# Patient Record
Sex: Male | Born: 1960
Health system: Southern US, Community
[De-identification: ages and names within clinical notes are randomized; demographics above are authoritative.]

## PROBLEM LIST (undated history)

## (undated) DIAGNOSIS — M1990 Unspecified osteoarthritis, unspecified site: Secondary | ICD-10-CM

## (undated) DIAGNOSIS — T8859XA Other complications of anesthesia, initial encounter: Secondary | ICD-10-CM

## (undated) DIAGNOSIS — T4145XA Adverse effect of unspecified anesthetic, initial encounter: Secondary | ICD-10-CM

## (undated) DIAGNOSIS — H409 Unspecified glaucoma: Secondary | ICD-10-CM

## (undated) HISTORY — DX: Unspecified glaucoma: H40.9

## (undated) HISTORY — PX: ROTATOR CUFF REPAIR: SHX139

## (undated) HISTORY — PX: COLONOSCOPY: SHX174

## (undated) HISTORY — PX: EYE SURGERY: SHX253

## (undated) HISTORY — PX: TONSILLECTOMY: SUR1361

---

## 2000-01-25 ENCOUNTER — Emergency Department (HOSPITAL_COMMUNITY): Admission: EM | Admit: 2000-01-25 | Discharge: 2000-01-25 | Payer: Self-pay | Admitting: Emergency Medicine

## 2002-03-23 ENCOUNTER — Encounter: Payer: Self-pay | Admitting: *Deleted

## 2002-03-23 ENCOUNTER — Ambulatory Visit (HOSPITAL_COMMUNITY): Admission: RE | Admit: 2002-03-23 | Discharge: 2002-03-23 | Payer: Self-pay | Admitting: *Deleted

## 2002-12-21 ENCOUNTER — Ambulatory Visit: Admission: RE | Admit: 2002-12-21 | Discharge: 2002-12-21 | Payer: Self-pay

## 2004-12-08 ENCOUNTER — Ambulatory Visit (HOSPITAL_COMMUNITY): Admission: RE | Admit: 2004-12-08 | Discharge: 2004-12-08 | Payer: Self-pay | Admitting: Orthopedic Surgery

## 2013-05-16 ENCOUNTER — Encounter (HOSPITAL_COMMUNITY): Payer: Self-pay | Admitting: Pharmacy Technician

## 2013-05-17 ENCOUNTER — Encounter (HOSPITAL_COMMUNITY)
Admission: RE | Admit: 2013-05-17 | Discharge: 2013-05-17 | Disposition: A | Discharge status: Home / Self Care | Payer: BC Managed Care – PPO | Source: Ambulatory Visit | Attending: Orthopedic Surgery | Admitting: Orthopedic Surgery

## 2013-05-17 ENCOUNTER — Encounter (HOSPITAL_COMMUNITY): Payer: Self-pay

## 2013-05-17 DIAGNOSIS — Z01812 Encounter for preprocedural laboratory examination: Secondary | ICD-10-CM | POA: Insufficient documentation

## 2013-05-17 HISTORY — DX: Unspecified osteoarthritis, unspecified site: M19.90

## 2013-05-17 HISTORY — DX: Adverse effect of unspecified anesthetic, initial encounter: T41.45XA

## 2013-05-17 HISTORY — DX: Other complications of anesthesia, initial encounter: T88.59XA

## 2013-05-17 LAB — BASIC METABOLIC PANEL
BUN: 16 mg/dL (ref 6–23)
CO2: 32 mEq/L (ref 19–32)
Calcium: 9.6 mg/dL (ref 8.4–10.5)
Chloride: 100 mEq/L (ref 96–112)
Creatinine, Ser: 0.93 mg/dL (ref 0.50–1.35)
GFR calc Af Amer: >90 mL/min (ref >90)
GFR calc non Af Amer: >90 mL/min (ref >90)
Glucose, Bld: 92 mg/dL (ref 70–99)
Potassium: 4.9 mEq/L (ref 3.5–5.1)
Sodium: 137 mEq/L (ref 135–145)

## 2013-05-17 LAB — CBC
HCT: 42 % (ref 39.0–52.0)
Hemoglobin: 14.5 g/dL (ref 13.0–17.0)
MCH: 29.3 pg (ref 26.0–34.0)
MCV: 84.8 fL (ref 78.0–100.0)
RBC: 4.95 MIL/uL (ref 4.22–5.81)

## 2013-05-17 LAB — APTT: aPTT: 33 seconds (ref 24–37)

## 2013-05-17 LAB — PROTIME-INR
INR: 1.03 (ref 0.00–1.49)
Prothrombin Time: 13.4 seconds (ref 11.6–15.2)

## 2013-05-17 LAB — SURGICAL PCR SCREEN: Staphylococcus aureus: NEGATIVE

## 2013-05-17 NOTE — Patient Instructions (Addendum)
20 Anthony Warren.  05/17/2013   Your procedure is scheduled on: 05/29/13  Report to [facility] at 2:15 PM.  Call this number if you have problems the morning of surgery [phone]   Remember:   Do not eat food After Midnight, clear liquids from midnight until 1115 on 05/29/13 then nothing.     Do not wear jewelry, make-up or nail polish.  Do not wear lotions, powders, or perfumes. You may wear deodorant.  Do not shave 48 hours prior to surgery. Men may shave face and neck.  Do not bring valuables to the hospital.  Contacts, dentures or bridgework may not be worn into surgery.  Leave suitcase in the car. After surgery it may be brought to your room.  For patients admitted to the hospital, checkout time is 11:00 AM the day of discharge.   Please read over the following fact sheets that you were given: MRSA Information, clear liquids fact sheet, blood fact sheet Birdie Sons, RN  pre op nurse call if needed [phone]    FAILURE TO FOLLOW THESE INSTRUCTIONS MAY RESULT IN CANCELLATION OF YOUR SURGERY   Patient Signature: ___________________________________________

## 2013-05-25 NOTE — Progress Notes (Signed)
Pt called about surgery time change from 05/29/13 to 05/28/13. Pt agreed to be at short stay by 1245pm.

## 2013-05-27 NOTE — H&P (Signed)
TOTAL HIP ADMISSION H&P  Patient is admitted for right total hip arthroplasty, anterior approach.  Subjective:  Chief Complaint: right hip OA / pain.  HPI: Anthony Banning Sr., 52 y.o. male, has a history of pain and functional disability in the right hip(s) due to arthritis and patient has failed non-surgical conservative treatments for greater than 12 weeks to include NSAID's and/or analgesics, corticosteriod injections and activity modification.  Onset of symptoms was gradual starting 2 years ago with rapidlly worsening course since that time.The patient noted no past surgery on the right hip(s).  Patient currently rates pain in the right hip at 8 out of 10 with activity. Patient has night pain, worsening of pain with activity and weight bearing, trendelenberg gait, pain that interfers with activities of daily living, pain with passive range of motion and joint swelling. Patient has evidence of periarticular osteophytes and joint space narrowing by imaging studies. This condition presents safety issues increasing the risk of falls.  There is no current active infection.  Risks, benefits and expectations were discussed with the patient. Patient understand the risks, benefits and expectations and wishes to proceed with surgery.   D/C Plans:   Home with HHPT  Post-op Meds:    ?  Tranexamic Acid:   To be given  Decadron:    To be given  FYI:    ASA post-op   Past Medical History  Diagnosis Date  . Arthritis     in hips  . Complication of anesthesia     hard to wake up from 2nd retinal surgery (6weeks after first)    Past Surgical History  Procedure Laterality Date  . Eye surgery Bilateral     retinal detachment, cataracts, right eye glaucoma  . Rotator cuff repair Left 5 years ago  . Tonsillectomy      Allergies  Allergen Reactions  . Penicillins Rash    History  Substance Use Topics  . Smoking status: Never Smoker   . Smokeless tobacco: Never Used  . Alcohol Use: Yes   Comment: about 1 a day    No family history on file.   Review of Systems  Constitutional: Negative.   HENT: Negative.   Eyes: Negative.   Respiratory: Negative.   Cardiovascular: Negative.   Gastrointestinal: Negative.   Genitourinary: Negative.   Musculoskeletal: Positive for joint pain.  Skin: Negative.   Neurological: Negative.   Endo/Heme/Allergies: Negative.   Psychiatric/Behavioral: Negative.     Objective:  Physical Exam  Constitutional: He is oriented to person, place, and time. He appears well-developed and well-nourished.  HENT:  Head: Normocephalic and atraumatic.  Mouth/Throat: Oropharynx is clear and moist.  Eyes: Pupils are equal, round, and reactive to light.  Neck: Neck supple. No JVD present. No tracheal deviation present. No thyromegaly present.  Cardiovascular: Normal rate, regular rhythm, normal heart sounds and intact distal pulses.   Respiratory: Effort normal and breath sounds normal. No stridor. No respiratory distress. He has no wheezes.  GI: Soft. There is no tenderness. There is no guarding.  Musculoskeletal:       Right hip: He exhibits decreased range of motion, decreased strength, tenderness and bony tenderness. He exhibits no swelling, no deformity and no laceration.  Lymphadenopathy:    He has no cervical adenopathy.  Neurological: He is alert and oriented to person, place, and time.  Skin: Skin is warm and dry.  Psychiatric: He has a normal mood and affect.     Imaging Review Plain radiographs demonstrate severe  degenerative joint disease of the right hip(s). The bone quality appears to be good for age and reported activity level.  Assessment/Plan:  End stage arthritis, right hip(s)  The patient history, physical examination, clinical judgement of the provider and imaging studies are consistent with end stage degenerative joint disease of the right hip(s) and total hip arthroplasty is deemed medically necessary. The treatment options  including medical management, injection therapy, arthroscopy and arthroplasty were discussed at length. The risks and benefits of total hip arthroplasty were presented and reviewed. The risks due to aseptic loosening, infection, stiffness, dislocation/subluxation,  thromboembolic complications and other imponderables were discussed.  The patient acknowledged the explanation, agreed to proceed with the plan and consent was signed. Patient is being admitted for inpatient treatment for surgery, pain control, PT, OT, prophylactic antibiotics, VTE prophylaxis, progressive ambulation and ADL's and discharge planning.The patient is planning to be discharged home with home health services.   Anastasio Auerbach Camren Henthorn   PAC  05/27/2013, 5:46 PM

## 2013-05-28 ENCOUNTER — Ambulatory Visit (HOSPITAL_COMMUNITY): Payer: BC Managed Care – PPO | Admitting: Anesthesiology

## 2013-05-28 ENCOUNTER — Encounter (HOSPITAL_COMMUNITY)
Admission: RE | Disposition: A | Discharge status: Home Health | Payer: Self-pay | Source: Ambulatory Visit | Attending: Orthopedic Surgery

## 2013-05-28 ENCOUNTER — Inpatient Hospital Stay (HOSPITAL_COMMUNITY): Payer: BC Managed Care – PPO

## 2013-05-28 ENCOUNTER — Encounter (HOSPITAL_COMMUNITY): Payer: Self-pay

## 2013-05-28 ENCOUNTER — Ambulatory Visit (HOSPITAL_COMMUNITY): Payer: BC Managed Care – PPO

## 2013-05-28 ENCOUNTER — Encounter (HOSPITAL_COMMUNITY): Payer: Self-pay | Admitting: Anesthesiology

## 2013-05-28 ENCOUNTER — Inpatient Hospital Stay (HOSPITAL_COMMUNITY)
Admission: RE | Admit: 2013-05-28 | Discharge: 2013-05-29 | DRG: 818 | Disposition: A | Discharge status: Home Health | Payer: BC Managed Care – PPO | Source: Ambulatory Visit | Attending: Orthopedic Surgery | Admitting: Orthopedic Surgery

## 2013-05-28 DIAGNOSIS — D62 Acute posthemorrhagic anemia: Secondary | ICD-10-CM | POA: Diagnosis not present

## 2013-05-28 DIAGNOSIS — D500 Iron deficiency anemia secondary to blood loss (chronic): Secondary | ICD-10-CM | POA: Diagnosis not present

## 2013-05-28 DIAGNOSIS — M1610 Unilateral primary osteoarthritis, unspecified hip: Principal | ICD-10-CM | POA: Diagnosis present

## 2013-05-28 DIAGNOSIS — M169 Osteoarthritis of hip, unspecified: Principal | ICD-10-CM | POA: Diagnosis present

## 2013-05-28 DIAGNOSIS — Z880 Allergy status to penicillin: Secondary | ICD-10-CM

## 2013-05-28 DIAGNOSIS — Z96649 Presence of unspecified artificial hip joint: Secondary | ICD-10-CM

## 2013-05-28 DIAGNOSIS — M87059 Idiopathic aseptic necrosis of unspecified femur: Secondary | ICD-10-CM | POA: Diagnosis present

## 2013-05-28 HISTORY — PX: TOTAL HIP ARTHROPLASTY: SHX124

## 2013-05-28 LAB — TYPE AND SCREEN: Antibody Screen: NEGATIVE

## 2013-05-28 LAB — URINALYSIS, ROUTINE W REFLEX MICROSCOPIC
Leukocytes, UA: NEGATIVE
Nitrite: NEGATIVE
Specific Gravity, Urine: 1.008 (ref 1.005–1.030)
Urobilinogen, UA: 0.2 mg/dL (ref 0.0–1.0)
pH: 5.5 (ref 5.0–8.0)

## 2013-05-28 SURGERY — ARTHROPLASTY, HIP, TOTAL, ANTERIOR APPROACH
Anesthesia: General | Site: Hip | Laterality: Right | Wound class: Clean

## 2013-05-28 MED ORDER — CLINDAMYCIN PHOSPHATE 900 MG/50ML IV SOLN
900.0000 mg | INTRAVENOUS | Status: DC
  Filled 2013-05-28: qty 50

## 2013-05-28 MED ORDER — HYDROCODONE-ACETAMINOPHEN 7.5-325 MG PO TABS
1.0000 | ORAL_TABLET | ORAL | Status: DC
  Administered 2013-05-28: 1 via ORAL
  Administered 2013-05-29 (×3): 2 via ORAL
  Administered 2013-05-29: 1 via ORAL
  Filled 2013-05-28 (×5): qty 2

## 2013-05-28 MED ORDER — KETOROLAC TROMETHAMINE 30 MG/ML IJ SOLN
INTRAMUSCULAR | Status: AC
  Filled 2013-05-28: qty 1

## 2013-05-28 MED ORDER — PHENOL 1.4 % MT LIQD: 1.0000 | OROMUCOSAL | Status: DC | PRN

## 2013-05-28 MED ORDER — MIDAZOLAM HCL 5 MG/5ML IJ SOLN
INTRAMUSCULAR | Status: DC | PRN
  Administered 2013-05-28: 2 mg via INTRAVENOUS

## 2013-05-28 MED ORDER — DIPHENHYDRAMINE HCL 25 MG PO CAPS: 25.0000 mg | ORAL_CAPSULE | Freq: Four times a day (QID) | ORAL | Status: DC | PRN

## 2013-05-28 MED ORDER — ONDANSETRON HCL 4 MG/2ML IJ SOLN
INTRAMUSCULAR | Status: DC | PRN
  Administered 2013-05-28: 4 mg via INTRAVENOUS

## 2013-05-28 MED ORDER — NEOSTIGMINE METHYLSULFATE 1 MG/ML IJ SOLN
INTRAMUSCULAR | Status: DC | PRN
  Administered 2013-05-28: 3 mg via INTRAVENOUS

## 2013-05-28 MED ORDER — METOCLOPRAMIDE HCL 5 MG/ML IJ SOLN: 5.0000 mg | Freq: Three times a day (TID) | INTRAMUSCULAR | Status: DC | PRN

## 2013-05-28 MED ORDER — BUPIVACAINE LIPOSOME 1.3 % IJ SUSP
20.0000 mL | Freq: Once | INTRAMUSCULAR | Status: DC
  Filled 2013-05-28: qty 20

## 2013-05-28 MED ORDER — HYDROMORPHONE HCL PF 1 MG/ML IJ SOLN
INTRAMUSCULAR | Status: AC
  Filled 2013-05-28: qty 1

## 2013-05-28 MED ORDER — HYDROMORPHONE HCL PF 1 MG/ML IJ SOLN
0.2500 mg | INTRAMUSCULAR | Status: DC | PRN
  Administered 2013-05-28: 0.5 mg via INTRAVENOUS
  Administered 2013-05-28: 0.25 mg via INTRAVENOUS

## 2013-05-28 MED ORDER — CHLORHEXIDINE GLUCONATE 4 % EX LIQD
60.0000 mL | Freq: Once | CUTANEOUS | Status: DC
  Filled 2013-05-28: qty 60

## 2013-05-28 MED ORDER — HYDROMORPHONE HCL PF 1 MG/ML IJ SOLN
INTRAMUSCULAR | Status: DC | PRN
  Administered 2013-05-28 (×2): 1 mg via INTRAVENOUS

## 2013-05-28 MED ORDER — ONDANSETRON HCL 4 MG/2ML IJ SOLN
4.0000 mg | Freq: Four times a day (QID) | INTRAMUSCULAR | Status: DC | PRN
  Administered 2013-05-29: 4 mg via INTRAVENOUS
  Filled 2013-05-28: qty 2

## 2013-05-28 MED ORDER — HYDROMORPHONE HCL PF 1 MG/ML IJ SOLN: 0.5000 mg | INTRAMUSCULAR | Status: DC | PRN

## 2013-05-28 MED ORDER — DEXAMETHASONE SODIUM PHOSPHATE 10 MG/ML IJ SOLN
INTRAMUSCULAR | Status: DC | PRN
  Administered 2013-05-28: 10 mg via INTRAVENOUS

## 2013-05-28 MED ORDER — BUPIVACAINE-EPINEPHRINE PF 0.25-1:200000 % IJ SOLN
INTRAMUSCULAR | Status: AC
  Filled 2013-05-28: qty 30

## 2013-05-28 MED ORDER — FENTANYL CITRATE 0.05 MG/ML IJ SOLN
INTRAMUSCULAR | Status: DC | PRN
  Administered 2013-05-28: 50 ug via INTRAVENOUS
  Administered 2013-05-28: 100 ug via INTRAVENOUS
  Administered 2013-05-28 (×2): 50 ug via INTRAVENOUS

## 2013-05-28 MED ORDER — SODIUM CHLORIDE 0.9 % IV SOLN
100.0000 mL/h | INTRAVENOUS | Status: DC
  Administered 2013-05-28: 100 mL/h via INTRAVENOUS
  Filled 2013-05-28 (×3): qty 1000

## 2013-05-28 MED ORDER — LIDOCAINE HCL (PF) 2 % IJ SOLN
INTRAMUSCULAR | Status: DC | PRN
  Administered 2013-05-28: 30 mg

## 2013-05-28 MED ORDER — CLINDAMYCIN PHOSPHATE 600 MG/50ML IV SOLN
600.0000 mg | Freq: Four times a day (QID) | INTRAVENOUS | Status: AC
  Administered 2013-05-28 – 2013-05-29 (×2): 600 mg via INTRAVENOUS
  Filled 2013-05-28 (×2): qty 50

## 2013-05-28 MED ORDER — LACTATED RINGERS IV SOLN
INTRAVENOUS | Status: DC
  Administered 2013-05-28: 18:00:00 via INTRAVENOUS
  Administered 2013-05-28: 1000 mL via INTRAVENOUS

## 2013-05-28 MED ORDER — ROCURONIUM BROMIDE 100 MG/10ML IV SOLN
INTRAVENOUS | Status: DC | PRN
  Administered 2013-05-28: 50 mg via INTRAVENOUS
  Administered 2013-05-28: 10 mg via INTRAVENOUS
  Administered 2013-05-28: 20 mg via INTRAVENOUS

## 2013-05-28 MED ORDER — ASPIRIN EC 325 MG PO TBEC
325.0000 mg | DELAYED_RELEASE_TABLET | Freq: Two times a day (BID) | ORAL | Status: DC
  Administered 2013-05-29: 325 mg via ORAL
  Filled 2013-05-28 (×3): qty 1

## 2013-05-28 MED ORDER — CEFAZOLIN SODIUM-DEXTROSE 2-3 GM-% IV SOLR
INTRAVENOUS | Status: DC | PRN
  Administered 2013-05-28: 2 g via INTRAVENOUS

## 2013-05-28 MED ORDER — METOCLOPRAMIDE HCL 5 MG PO TABS: 5.0000 mg | ORAL_TABLET | Freq: Three times a day (TID) | ORAL | Status: DC | PRN

## 2013-05-28 MED ORDER — METHOCARBAMOL 100 MG/ML IJ SOLN
500.0000 mg | Freq: Four times a day (QID) | INTRAVENOUS | Status: DC | PRN
  Administered 2013-05-28: 500 mg via INTRAVENOUS
  Filled 2013-05-28: qty 5

## 2013-05-28 MED ORDER — DEXAMETHASONE SODIUM PHOSPHATE 10 MG/ML IJ SOLN
10.0000 mg | Freq: Once | INTRAMUSCULAR | Status: AC
  Administered 2013-05-29: 10 mg via INTRAVENOUS
  Filled 2013-05-28: qty 1

## 2013-05-28 MED ORDER — BISACODYL 10 MG RE SUPP: 10.0000 mg | Freq: Every day | RECTAL | Status: DC | PRN

## 2013-05-28 MED ORDER — KETOROLAC TROMETHAMINE 30 MG/ML IJ SOLN
INTRAMUSCULAR | Status: DC | PRN
  Administered 2013-05-28: 15 mg via INTRAVENOUS

## 2013-05-28 MED ORDER — LACTATED RINGERS IV SOLN: INTRAVENOUS | Status: DC

## 2013-05-28 MED ORDER — GLYCOPYRROLATE 0.2 MG/ML IJ SOLN
INTRAMUSCULAR | Status: DC | PRN
  Administered 2013-05-28: .5 mg via INTRAVENOUS

## 2013-05-28 MED ORDER — PROPOFOL 10 MG/ML IV BOLUS
INTRAVENOUS | Status: DC | PRN
  Administered 2013-05-28: 200 mg via INTRAVENOUS

## 2013-05-28 MED ORDER — FERROUS SULFATE 325 (65 FE) MG PO TABS
325.0000 mg | ORAL_TABLET | Freq: Three times a day (TID) | ORAL | Status: DC
  Administered 2013-05-29 (×2): 325 mg via ORAL
  Filled 2013-05-28 (×4): qty 1

## 2013-05-28 MED ORDER — ZOLPIDEM TARTRATE 5 MG PO TABS: 5.0000 mg | ORAL_TABLET | Freq: Every evening | ORAL | Status: DC | PRN

## 2013-05-28 MED ORDER — FLEET ENEMA 7-19 GM/118ML RE ENEM: 1.0000 | ENEMA | Freq: Once | RECTAL | Status: AC | PRN

## 2013-05-28 MED ORDER — ONDANSETRON HCL 4 MG PO TABS: 4.0000 mg | ORAL_TABLET | Freq: Four times a day (QID) | ORAL | Status: DC | PRN

## 2013-05-28 MED ORDER — STERILE WATER FOR IRRIGATION IR SOLN
Status: DC | PRN
  Administered 2013-05-28: 1000 mL

## 2013-05-28 MED ORDER — ALUM & MAG HYDROXIDE-SIMETH 200-200-20 MG/5ML PO SUSP: 30.0000 mL | ORAL | Status: DC | PRN

## 2013-05-28 MED ORDER — POLYETHYLENE GLYCOL 3350 17 G PO PACK: 17.0000 g | PACK | Freq: Two times a day (BID) | ORAL | Status: DC

## 2013-05-28 MED ORDER — DOCUSATE SODIUM 100 MG PO CAPS
100.0000 mg | ORAL_CAPSULE | Freq: Two times a day (BID) | ORAL | Status: DC
  Administered 2013-05-28 – 2013-05-29 (×2): 100 mg via ORAL

## 2013-05-28 MED ORDER — CEFAZOLIN SODIUM-DEXTROSE 2-3 GM-% IV SOLR
INTRAVENOUS | Status: AC
  Filled 2013-05-28: qty 50

## 2013-05-28 MED ORDER — CELECOXIB 200 MG PO CAPS
200.0000 mg | ORAL_CAPSULE | Freq: Two times a day (BID) | ORAL | Status: DC
  Administered 2013-05-28 – 2013-05-29 (×2): 200 mg via ORAL
  Filled 2013-05-28 (×3): qty 1

## 2013-05-28 MED ORDER — METHOCARBAMOL 500 MG PO TABS
500.0000 mg | ORAL_TABLET | Freq: Four times a day (QID) | ORAL | Status: DC | PRN
  Administered 2013-05-29: 500 mg via ORAL
  Filled 2013-05-28: qty 1

## 2013-05-28 MED ORDER — TRANEXAMIC ACID 100 MG/ML IV SOLN
1000.0000 mg | Freq: Once | INTRAVENOUS | Status: AC
  Administered 2013-05-28: 1000 mg via INTRAVENOUS
  Filled 2013-05-28: qty 10

## 2013-05-28 MED ORDER — DEXAMETHASONE SODIUM PHOSPHATE 10 MG/ML IJ SOLN: 10.0000 mg | Freq: Once | INTRAMUSCULAR | Status: DC

## 2013-05-28 MED ORDER — MENTHOL 3 MG MT LOZG: 1.0000 | LOZENGE | OROMUCOSAL | Status: DC | PRN

## 2013-05-28 SURGICAL SUPPLY — 39 items
ADH SKN CLS APL DERMABOND .7 (GAUZE/BANDAGES/DRESSINGS) ×1
BAG SPEC THK2 15X12 ZIP CLS (MISCELLANEOUS) ×2
BAG ZIPLOCK 12X15 (MISCELLANEOUS) ×4 IMPLANT
BLADE SAW SGTL 18X1.27X75 (BLADE) ×2 IMPLANT
CLOTH BEACON ORANGE TIMEOUT ST (SAFETY) ×2 IMPLANT
DERMABOND ADVANCED (GAUZE/BANDAGES/DRESSINGS) ×1
DERMABOND ADVANCED .7 DNX12 (GAUZE/BANDAGES/DRESSINGS) ×1 IMPLANT
DRAPE C-ARM 42X72 X-RAY (DRAPES) ×2 IMPLANT
DRAPE STERI IOBAN 125X83 (DRAPES) ×2 IMPLANT
DRAPE U-SHAPE 47X51 STRL (DRAPES) ×6 IMPLANT
DRSG AQUACEL AG ADV 3.5X10 (GAUZE/BANDAGES/DRESSINGS) ×2 IMPLANT
DRSG TEGADERM 4X4.75 (GAUZE/BANDAGES/DRESSINGS) ×2 IMPLANT
DURAPREP 26ML APPLICATOR (WOUND CARE) ×2 IMPLANT
ELECT BLADE TIP CTD 4 INCH (ELECTRODE) ×2 IMPLANT
ELECT REM PT RETURN 9FT ADLT (ELECTROSURGICAL) ×2
ELECTRODE REM PT RTRN 9FT ADLT (ELECTROSURGICAL) ×1 IMPLANT
EVACUATOR 1/8 PVC DRAIN (DRAIN) IMPLANT
FACESHIELD LNG OPTICON STERILE (SAFETY) ×8 IMPLANT
GAUZE SPONGE 2X2 8PLY STRL LF (GAUZE/BANDAGES/DRESSINGS) ×1 IMPLANT
GLOVE BIOGEL PI IND STRL 7.5 (GLOVE) ×1 IMPLANT
GLOVE BIOGEL PI IND STRL 8 (GLOVE) ×1 IMPLANT
GLOVE BIOGEL PI INDICATOR 7.5 (GLOVE) ×1
GLOVE BIOGEL PI INDICATOR 8 (GLOVE) ×1
GLOVE ECLIPSE 8.0 STRL XLNG CF (GLOVE) ×2 IMPLANT
GLOVE ORTHO TXT STRL SZ7.5 (GLOVE) ×4 IMPLANT
GOWN BRE IMP PREV XXLGXLNG (GOWN DISPOSABLE) ×2 IMPLANT
GOWN STRL NON-REIN LRG LVL3 (GOWN DISPOSABLE) ×2 IMPLANT
KIT BASIN OR (CUSTOM PROCEDURE TRAY) ×2 IMPLANT
PACK TOTAL JOINT (CUSTOM PROCEDURE TRAY) ×2 IMPLANT
PADDING CAST COTTON 6X4 STRL (CAST SUPPLIES) ×2 IMPLANT
SPONGE GAUZE 2X2 STER 10/PKG (GAUZE/BANDAGES/DRESSINGS) ×1
SUCTION FRAZIER 12FR DISP (SUCTIONS) ×2 IMPLANT
SUT MNCRL AB 4-0 PS2 18 (SUTURE) ×2 IMPLANT
SUT VIC AB 1 CT1 36 (SUTURE) ×8 IMPLANT
SUT VIC AB 2-0 CT1 27 (SUTURE) ×4
SUT VIC AB 2-0 CT1 TAPERPNT 27 (SUTURE) ×2 IMPLANT
SUT VLOC 180 0 24IN GS25 (SUTURE) ×2 IMPLANT
TOWEL OR 17X26 10 PK STRL BLUE (TOWEL DISPOSABLE) ×4 IMPLANT
TRAY FOLEY CATH 14FRSI W/METER (CATHETERS) ×2 IMPLANT

## 2013-05-28 NOTE — Op Note (Signed)
NAME:  Anthony Warren NO.: [id number]      MEDICAL RECORD NO.: [id number]      FACILITY:  [facility]      PHYSICIAN:  Durene Romans D  DATE OF BIRTH:  11-14-1961     DATE OF PROCEDURE:  05/28/2013                                 OPERATIVE REPORT         PREOPERATIVE DIAGNOSIS: Right  hip avascular necrosis     POSTOPERATIVE DIAGNOSIS:  Right hip avascular necrosis     PROCEDURE:  Right total hip replacement through an anterior approach   utilizing DePuy THR system, component size 54mm pinnacle cup, a size 36+4 neutral   Altrex liner, a size 6 Hi Tri Lock stem with a 36+1.5 delta ceramic   ball.      SURGEON:  Madlyn Frankel. Charlann Boxer, M.D.      ASSISTANT:  Lanney Gins, PA      ANESTHESIA:  General.      SPECIMENS:  None.      COMPLICATIONS:  None.      BLOOD LOSS:  1000 cc     DRAINS:  One Hemovac.      INDICATION OF THE PROCEDURE:  Anthony SELLICK Sr. is a 52 y.o. male who had   presented to office for evaluation of right hip pain.  Radiographs revealed   progressive degenerative changes with bone-on-bone   articulation to the  hip joint.  The patient had painful limited range of   motion significantly affecting their overall quality of life.  The patient was failing to    respond to conservative measures, and at this point was ready   to proceed with more definitive measures.  The patient has noted progressive   degenerative changes in his hip, progressive problems and dysfunction   with regarding the hip prior to surgery.  Consent was obtained for   benefit of pain relief.  Specific risk of infection, DVT, component   failure, dislocation, need for revision surgery, as well discussion of   the anterior versus posterior approach were reviewed.  Consent was   obtained for benefit of anterior pain relief through an anterior   approach.      PROCEDURE IN DETAIL:  The patient was brought to operative theater.   Once adequate  anesthesia, preoperative antibiotics, 2gm Ancef administered.   The patient was positioned supine on the OSI Hanna table.  Once adequate   padding of boney process was carried out, we had predraped out the hip, and  used fluoroscopy to confirm orientation of the pelvis and position.      The right hip was then prepped and draped from proximal iliac crest to   mid thigh with shower curtain technique.      Time-out was performed identifying the patient, planned procedure, and   extremity.     An incision was then made 2 cm distal and lateral to the   anterior superior iliac spine extending over the orientation of the   tensor fascia lata muscle and sharp dissection was carried down to the   fascia of the muscle and protractor placed in the soft tissues.      The fascia was then incised.  The muscle belly was  identified and swept   laterally and retractor placed along the superior neck.  Following   cauterization of the circumflex vessels and removing some pericapsular   fat, a second cobra retractor was placed on the inferior neck.  A third   retractor was placed on the anterior acetabulum after elevating the   anterior rectus.  A L-capsulotomy was along the line of the   superior neck to the trochanteric fossa, then extended proximally and   distally.  Tag sutures were placed and the retractors were then placed   intracapsular.  We then identified the trochanteric fossa and   orientation of my neck cut, confirmed this radiographically   and then made a neck osteotomy with the femur on traction.  The femoral   head was removed without difficulty or complication.  Traction was let   off and retractors were placed posterior and anterior around the   acetabulum.      The labrum and foveal tissue were debrided.  I began reaming with a 47mm   reamer and reamed up to 53mm reamer with good bony bed preparation and a 54   cup was chosen.  The final 54mm Pinnacle cup was then impacted under  fluoroscopy  to confirm the depth of penetration and orientation with respect to   abduction.  A screw was placed followed by the hole eliminator.  The final   36+4 neutral Altrex liner was impacted with good visualized rim fit.  The cup was positioned anatomically within the acetabular portion of the pelvis.      At this point, the femur was rolled at 80 degrees.  Further capsule was   released off the inferior aspect of the femoral neck.  I then   released the superior capsule proximally.  The hook was placed laterally   along the femur and elevated manually and held in position with the bed   hook.  The leg was then extended and adducted with the leg rolled to 100   degrees of external rotation.  Once the proximal femur was fully   exposed, I used a box osteotome to set orientation.  I then began   broaching with the starting chili pepper broach and passed this by hand and then broached up to 6.  With the 6 broach in place I chose a high offset neck and did a trial reduction with the 36+1.5 head trials.  The offset was appropriate, leg lengths   appeared to be equal, confirmed radiographically.   Given these findings, I went ahead and dislocated the hip, repositioned all   retractors and positioned the right hip in the extended and abducted position.  The final 6 Hi Tri Lock stem was   chosen and it was impacted down to the level of neck cut.  Based on this   and the trial reduction, a 36+1.5 delta ceramic ball was chosen and   impacted onto a clean and dry trunnion, and the hip was reduced.  The   hip had been irrigated throughout the case again at this point.  I did   reapproximate the superior capsular leaflet to the anterior leaflet   using #1 Vicryl, placed a medium Hemovac drain deep.  The fascia of the   tensor fascia lata muscle was then reapproximated using #1 Vicryl.  The   remaining wound was closed with 2-0 Vicryl and running 4-0 Monocryl.   The hip was cleaned, dried, and  dressed sterilely using Dermabond and  Aquacel dressing.  Drain site dressed separately.  She was then brought   to recovery room in stable condition tolerating the procedure well.    Danae Orleans, PA-C was present for the entirety of the case involved from   preoperative positioning, perioperative retractor management, general   facilitation of the case, as well as primary wound closure as assistant.            Pietro Cassis Alvan Dame, M.D.            MDO/MEDQ  D:  10/19/2011  T:  10/19/2011  Job:  [id number]      Electronically Signed by Paralee Cancel M.D. on 10/25/2011 09:15:38 AM

## 2013-05-28 NOTE — Anesthesia Preprocedure Evaluation (Addendum)
Anesthesia Evaluation  Patient identified by MRN, date of birth, ID band Patient awake    Reviewed: Allergy & Precautions, H&P , NPO status , Patient's Chart, lab work & pertinent test results  Airway Mallampati: II TM Distance: >3 FB Neck ROM: full    Dental  (+) Caps and Dental Advisory Given Bonding both upper front teeth:   Pulmonary neg pulmonary ROS,  breath sounds clear to auscultation  Pulmonary exam normal       Cardiovascular Exercise Tolerance: Good negative cardio ROS  Rhythm:regular Rate:Normal     Neuro/Psych negative neurological ROS  negative psych ROS   GI/Hepatic negative GI ROS, Neg liver ROS,   Endo/Other  negative endocrine ROS  Renal/GU negative Renal ROS  negative genitourinary   Musculoskeletal   Abdominal   Peds  Hematology negative hematology ROS (+)   Anesthesia Other Findings   Reproductive/Obstetrics negative OB ROS                          Anesthesia Physical Anesthesia Plan  ASA: II  Anesthesia Plan: General   Post-op Pain Management:    Induction: Intravenous  Airway Management Planned: Oral ETT  Additional Equipment:   Intra-op Plan:   Post-operative Plan: Extubation in OR  Informed Consent: I have reviewed the patients History and Physical, chart, labs and discussed the procedure including the risks, benefits and alternatives for the proposed anesthesia with the patient or authorized representative who has indicated his/her understanding and acceptance.   Dental Advisory Given  Plan Discussed with: CRNA and Surgeon  Anesthesia Plan Comments:         Anesthesia Quick Evaluation

## 2013-05-28 NOTE — Interval H&P Note (Signed)
History and Physical Interval Note:  05/28/2013 3:37 PM  Anthony Banning Sr.  has presented today for surgery, with the diagnosis of Right Hip Osteoarthritis  The various methods of treatment have been discussed with the patient and family. After consideration of risks, benefits and other options for treatment, the patient has consented to  Procedure(s): RIGHT TOTAL HIP ARTHROPLASTY ANTERIOR APPROACH (Right) as a surgical intervention .  The patient's history has been reviewed, patient examined, no change in status, stable for surgery.  I have reviewed the patient's chart and labs.  Questions were answered to the patient's satisfaction.     Shelda Pal

## 2013-05-28 NOTE — Preoperative (Signed)
Beta Blockers   Reason not to administer Beta Blockers:Not Applicable, Not on home beta blockers 

## 2013-05-28 NOTE — Transfer of Care (Signed)
Immediate Anesthesia Transfer of Care Note  Patient: Anthony AYALA Sr.  Procedure(s) Performed: Procedure(s) (LRB): RIGHT TOTAL HIP ARTHROPLASTY ANTERIOR APPROACH (Right)  Patient Location: PACU  Anesthesia Type: General  Level of Consciousness: sedated, patient cooperative and responds to stimulaton  Airway & Oxygen Therapy: Patient Spontanous Breathing and Patient connected to face mask oxgen  Post-op Assessment: Report given to PACU RN and Post -op Vital signs reviewed and stable  Post vital signs: Reviewed and stable  Complications: No apparent anesthesia complications

## 2013-05-28 NOTE — Anesthesia Postprocedure Evaluation (Signed)
  Anesthesia Post-op Note  Patient: Anthony Warren.  Procedure(s) Performed: Procedure(s) (LRB): RIGHT TOTAL HIP ARTHROPLASTY ANTERIOR APPROACH (Right)  Patient Location: PACU  Anesthesia Type: General  Level of Consciousness: awake and alert   Airway and Oxygen Therapy: Patient Spontanous Breathing  Post-op Pain: mild  Post-op Assessment: Post-op Vital signs reviewed, Patient's Cardiovascular Status Stable, Respiratory Function Stable, Patent Airway and No signs of Nausea or vomiting  Last Vitals:  Filed Vitals:   05/28/13 1954  BP: 102/60  Pulse: 49  Temp: 36.6 C  Resp: 15    Post-op Vital Signs: stable   Complications: No apparent anesthesia complications

## 2013-05-29 ENCOUNTER — Encounter (HOSPITAL_COMMUNITY): Payer: Self-pay

## 2013-05-29 DIAGNOSIS — D500 Iron deficiency anemia secondary to blood loss (chronic): Secondary | ICD-10-CM | POA: Diagnosis not present

## 2013-05-29 LAB — CBC
Platelets: 201 10*3/uL (ref 150–400)
RDW: 12.8 % (ref 11.5–15.5)
WBC: 13.1 10*3/uL — ABNORMAL HIGH (ref 4.0–10.5)

## 2013-05-29 LAB — BASIC METABOLIC PANEL
Chloride: 98 mEq/L (ref 96–112)
GFR calc Af Amer: >90 mL/min (ref >90)
Potassium: 4.4 mEq/L (ref 3.5–5.1)

## 2013-05-29 MED ORDER — HYDROCODONE-ACETAMINOPHEN 7.5-325 MG PO TABS: 1.0000 | ORAL_TABLET | ORAL | Status: DC

## 2013-05-29 MED ORDER — DSS 100 MG PO CAPS: 100.0000 mg | ORAL_CAPSULE | Freq: Two times a day (BID) | ORAL | Status: DC

## 2013-05-29 MED ORDER — POLYETHYLENE GLYCOL 3350 17 G PO PACK: 17.0000 g | PACK | Freq: Two times a day (BID) | ORAL | Status: DC

## 2013-05-29 MED ORDER — ASPIRIN 325 MG PO TBEC: 325.0000 mg | DELAYED_RELEASE_TABLET | Freq: Two times a day (BID) | ORAL | Status: DC

## 2013-05-29 MED ORDER — FERROUS SULFATE 325 (65 FE) MG PO TABS: 325.0000 mg | ORAL_TABLET | Freq: Three times a day (TID) | ORAL | Status: DC

## 2013-05-29 MED ORDER — METHOCARBAMOL 500 MG PO TABS: 500.0000 mg | ORAL_TABLET | Freq: Four times a day (QID) | ORAL | Status: DC | PRN

## 2013-05-29 NOTE — Progress Notes (Signed)
Discharged from floor via w/c, spouse with pt. No changes in assessment. Caidance Sybert   

## 2013-05-29 NOTE — Plan of Care (Signed)
Problem: Discharge Progression Outcomes Goal: Anticoagulant follow-up in place Outcome: Not Applicable Date Met:  05/29/13 asa

## 2013-05-29 NOTE — Evaluation (Signed)
Physical Therapy Evaluation Patient Details Name: Anthony NOBOA Sr. MRN: [id number] DOB: [birth year] Today's Date: 05/29/2013 Time: 4098-1191 PT Time Calculation (min): 24 min  PT Assessment / Plan / Recommendation Clinical Impression  Pt is a 52 year old male s/p R direct anterior THR.  Pt was very active prior to surgery and would like to return to previous lifestyle. Pt limited this morning due to dizziness, nausea, and low BP so only transferred to recliner.  Pt would benefit from acute PT services in order to improve independence with transfers, ambulation, and stairs in preparation for d/c home with spouse.    PT Assessment  Patient needs continued PT services    Follow Up Recommendations  Home health PT;Supervision for mobility/OOB    Does the patient have the potential to tolerate intense rehabilitation      Barriers to Discharge        Equipment Recommendations  Rolling walker with 5" wheels    Recommendations for Other Services     Frequency 7X/week    Precautions / Restrictions Precautions Precautions: Fall;None Precaution Comments: direct anterior THR Restrictions Other Position/Activity Restrictions: WBAT   Pertinent Vitals/Pain Premedicated, ice applied  BP: upon sitting: 85/51 mmHg Rechecked 2 min later 91/56 mmHg HR 62 Pt performed ankle pumps then BP 100/65 mmHg HR 80 (RN notified)     Mobility  Bed Mobility Bed Mobility: Supine to Sit Supine to Sit: 4: Min assist Details for Bed Mobility Assistance: assist for R LE Transfers Transfers: Stand to [employer];Sit to Stand Sit to Stand: 4: Min guard;With upper extremity assist;From bed Stand to Sit: 4: Min guard;With upper extremity assist;To chair/3-in-1 Stand Pivot Transfers: 4: Min guard Details for Transfer Assistance: verbal cues for technique, used RW for pivot to recliner, Pt TTWB and encouraged to try more weight however he stated "I havent been able to trust that leg in a while."   only transferred to recliner as pt became nauseated and dizzy upon sitting also with low BP which increased with time, water, and ankle pumps Ambulation/Gait Ambulation/Gait Assistance: Not tested (comment) Pt with slow transfers and increased time sitting EOB due to dizziness and checking BP   Exercises     PT Diagnosis: Difficulty walking  PT Problem List: Decreased strength;Decreased activity tolerance;Decreased mobility;Cardiopulmonary status limiting activity;Decreased knowledge of use of DME PT Treatment Interventions: Functional mobility training;Stair training;Gait training;DME instruction;Patient/family education;Therapeutic activities;Therapeutic exercise   PT Goals Acute Rehab PT Goals PT Goal Formulation: With patient Time For Goal Achievement: 06/02/13 Potential to Achieve Goals: Good Pt will go Supine/Side to Sit: with modified independence PT Goal: Supine/Side to Sit - Progress: Goal set today Pt will go Sit to Stand: with modified independence PT Goal: Sit to Stand - Progress: Goal set today Pt will go Stand to Sit: with modified independence PT Goal: Stand to Sit - Progress: Goal set today Pt will Ambulate: 51 - 150 feet;with modified independence;with least restrictive assistive device PT Goal: Ambulate - Progress: Goal set today Pt will Go Up / Down Stairs: 3-5 stairs;with supervision;with least restrictive assistive device PT Goal: Up/Down Stairs - Progress: Goal set today  Visit Information  Last PT Received On: 05/29/13 Assistance Needed: +1    Subjective Data  Subjective: I need to sit here a little longer. (dizzy and nauseated upon sitting)   Prior Functioning  Home Living Lives With: Spouse Available Help at Discharge: Family Type of Home: House Home Access: Stairs to enter [employer] of Steps: 4 Entrance  Stairs-Rails: None Home Layout: Two level Alternate Level Stairs-Number of Steps: 14 Alternate Level Stairs-Rails: Can reach  both Home Adaptive Equipment: Walker - rolling Prior Function Level of Independence: Independent Communication Communication: No difficulties    Cognition  Cognition Arousal/Alertness: Awake/alert Behavior During Therapy: WFL for tasks assessed/performed Overall Cognitive Status: Within Functional Limits for tasks assessed    Extremity/Trunk Assessment Right Upper Extremity Assessment RUE ROM/Strength/Tone: WFL for tasks assessed Left Upper Extremity Assessment LUE ROM/Strength/Tone: WFL for tasks assessed Right Lower Extremity Assessment RLE ROM/Strength/Tone: Deficits RLE ROM/Strength/Tone Deficits: assist for bed mobility, decreased AROM against gravity observed during transfers Left Lower Extremity Assessment LLE ROM/Strength/Tone: WFL for tasks assessed Trunk Assessment Trunk Assessment: Normal   Balance    End of Session PT - End of Session Activity Tolerance: Other (comment) (dizziness) Patient left: in chair;with call bell/phone within reach;with family/visitor present Nurse Communication: Other (comment);Mobility status (low BP)  GP     Oza Oberle,KATHrine E 05/29/2013, 1:13 PM Zenovia Jarred, PT, DPT 05/29/2013 Pager: [phone]

## 2013-05-29 NOTE — Progress Notes (Signed)
   Subjective: 1 Day Post-Op Procedure(s) (LRB): RIGHT TOTAL HIP ARTHROPLASTY ANTERIOR APPROACH (Right)   Patient reports pain as mild, pain well controlled. No events throughout the night. If he does well with PT and pain well controlled then he can be discharged home.  Objective:   VITALS:   Filed Vitals:   05/29/13 0534  BP: 104/66  Pulse: 79  Temp: 98.4 F (36.9 C)  Resp: 16    Neurovascular intact Dorsiflexion/Plantar flexion intact Incision: dressing C/D/I No cellulitis present Compartment soft  LABS  Recent Labs  05/29/13 0434  HGB 11.8*  HCT 34.0*  WBC 13.1*  PLT 201     Recent Labs  05/29/13 0434  NA 134*  K 4.4  BUN 17  CREATININE 0.88  GLUCOSE 184*     Assessment/Plan: 1 Day Post-Op Procedure(s) (LRB): RIGHT TOTAL HIP ARTHROPLASTY ANTERIOR APPROACH (Right) HV drain d/c'ed Foley cath d/c'ed Advance diet Up with therapy D/C IV fluids Discharge home with home health if does well with PT and pain controlled Follow up in 2 weeks at [facility] Follow up with OLIN,Nethaniel Mattie D in 2 weeks.  Contact information:  [facility] [address] [place] [address] [phone]    Expected ABLA  Treated with iron and will observe        Anastasio Auerbach. Hadlyn Amero   PAC  05/29/2013, 8:11 AM

## 2013-05-29 NOTE — Progress Notes (Signed)
Physical Therapy Treatment Note   05/29/13 1600  PT Visit Information  Last PT Received On 05/29/13  Assistance Needed +1  PT Time Calculation  PT Start Time 1520  PT Stop Time 1557  PT Time Calculation (min) 37 min  Subjective Data  Subjective I'm feeling much better after that medicine (premedicated with pain pill and muscle relaxer as pt reported spasms when checked on earlier)  Precautions  Precautions Fall;None  Precaution Comments direct anterior THR  Restrictions  Other Position/Activity Restrictions WBAT  Cognition  Arousal/Alertness Awake/alert  Behavior During Therapy WFL for tasks assessed/performed  Overall Cognitive Status Within Functional Limits for tasks assessed  Bed Mobility  Bed Mobility Not assessed  Transfers  Transfers Stand to Sit;Sit to Stand  Sit to Stand 4: Min guard;With upper extremity assist;From chair/3-in-1  Stand to Sit 4: Min guard;With upper extremity assist;To chair/3-in-1  Details for Transfer Assistance verbal cues for technique  Ambulation/Gait  Ambulation/Gait Assistance 4: Min guard;5: Supervision  Ambulation Distance (Feet) 180 Feet  Assistive device Rolling walker  Ambulation/Gait Assistance Details verbal cues for sequence, step length, RW distance  Gait Pattern Step-to pattern;Decreased stance time - right;Decreased weight shift to right;Decreased hip/knee flexion - right  Gait velocity decreased  General Gait Details discussed increasing hip/knee flexion with swing as tolerated  Stairs Yes  Stairs Assistance 4: Min guard  Stairs Assistance Details (indicate cue type and reason) pt performed forward step to with rails ascend/descend and then practiced backwards with RW and spouse holding RW backwards step to pattern,  spouse given handout on stairs backwards with RW as well  Stair Management Technique Two rails;Forwards;Step to pattern  Number of Stairs 3  PT - End of Session  Activity Tolerance Patient tolerated treatment well   Patient left with call bell/phone within reach;with family/visitor present;in chair  PT - Assessment/Plan  Comments on Treatment Session Pt able to ambulate and practice stairs this session to prepare for d/c home later today.  Also discussed and demonstrated ankle pumps, quad sets, and heel slides as tolerated once home until HHPT.  Spouse assisted with holding RW for stairs backwards (into home) and given handout.  Pt feels able/ready to d/c home today since pain and muscle spasms are now under control.  RN notified.  PT Plan Discharge plan remains appropriate;Frequency remains appropriate  Follow Up Recommendations Home health PT;Supervision for mobility/OOB  PT equipment Rolling walker with 5" wheels  Acute Rehab PT Goals  PT Goal: Sit to Stand - Progress Progressing toward goal  PT Goal: Stand to Sit - Progress Progressing toward goal  PT Goal: Ambulate - Progress Progressing toward goal  PT Goal: Up/Down Stairs - Progress Progressing toward goal  PT General Charges  $$ ACUTE PT VISIT 1 Procedure  PT Treatments  $Gait Training 23-37 mins  Premedicated  Zenovia Jarred, PT, DPT 05/29/2013 Pager: [phone]

## 2013-05-29 NOTE — Care Management Note (Signed)
    Page 1 of 2   05/29/2013     12:58:44 PM   CARE MANAGEMENT NOTE 05/29/2013  Patient:  Anthony Warren, Anthony Warren   Account Number:  [id number]  Date Initiated:  05/29/2013  Documentation initiated by:  Colleen Can  Subjective/Objective Assessment:   dx AVASCULAR NECROSIS; TOTAL HIP REPLACEMNT-ANTERIOR APPROACH.    Pre-arranged with Genevieve Norlander for [facility] services with start date of day after d/c.     Action/Plan:   CM spoke with patient. Plans are for patient to reurn to his home in [place] where spouse will be caregiver. Pt will need RW. Wants Genevieve Norlander for [facility] services.   Anticipated DC Date:  05/29/2013   Anticipated DC Plan:  HOME W HOME HEALTH SERVICES      DC Planning Services  CM consult      [facility] Choice  HOME HEALTH   Choice offered to / List presented to:  C-1 Patient   DME arranged  Levan Hurst      DME agency  [facility]     HH arranged  HH-2 PT      [facility] agency  [facility]   Status of service:  Completed, signed off Medicare Important Message given?   (If response is "NO", the following Medicare IM given date fields will be blank) Date Medicare IM given:   Date Additional Medicare IM given:    Discharge Disposition:    Per UR Regulation:  Reviewed for med. necessity/level of care/duration of stay  If discussed at Long Length of Stay Meetings, dates discussed:    Comments:

## 2013-05-30 ENCOUNTER — Encounter (HOSPITAL_COMMUNITY): Payer: Self-pay | Admitting: Orthopedic Surgery

## 2013-06-01 NOTE — Discharge Summary (Signed)
Physician Discharge Summary  Patient ID: TYDUS SANMIGUEL Sr. MRN: [id number] DOB/AGE: 1961/04/13 52 y.o.  Admit date: 05/28/2013 Discharge date: 05/29/2013   Procedures:  Procedure(s) (LRB): RIGHT TOTAL HIP ARTHROPLASTY ANTERIOR APPROACH (Right)  Attending Physician:  Dr. Durene Romans   Admission Diagnoses:   Right hip OA / pain  Discharge Diagnoses:  Principal Problem:   S/P right THA, AA Active Problems:   Expected blood loss anemia  Past Medical History  Diagnosis Date  . Arthritis     in hips  . Complication of anesthesia     hard to wake up from 2nd retinal surgery (6weeks after first)    HPI:    Anthony Banning Sr., 53 y.o. male, has a history of pain and functional disability in the right hip(s) due to arthritis and patient has failed non-surgical conservative treatments for greater than 12 weeks to include NSAID's and/or analgesics, corticosteriod injections and activity modification. Onset of symptoms was gradual starting 2 years ago with rapidlly worsening course since that time.The patient noted no past surgery on the right hip(s). Patient currently rates pain in the right hip at 8 out of 10 with activity. Patient has night pain, worsening of pain with activity and weight bearing, trendelenberg gait, pain that interfers with activities of daily living, pain with passive range of motion and joint swelling. Patient has evidence of periarticular osteophytes and joint space narrowing by imaging studies. This condition presents safety issues increasing the risk of falls. There is no current active infection. Risks, benefits and expectations were discussed with the patient. Patient understand the risks, benefits and expectations and wishes to proceed with surgery.   PCP: No PCP Per Patient   Discharged Condition: good  Hospital Course:  Patient underwent the above stated procedure on 05/28/2013. Patient tolerated the procedure well and brought to the recovery room in good condition  and subsequently to the floor.  POD #1 BP: 104/66 ; Pulse: 79 ; Temp: 98.4 F (36.9 C) ; Resp: 16  Pt's foley was removed, as well as the hemovac drain removed. IV was changed to a saline lock. Patient reports pain as mild, pain well controlled. No events throughout the night. If he does well with PT and pain well controlled then he can be discharged home. Neurovascular intact, dorsiflexion/plantar flexion intact, incision: dressing C/D/I, no cellulitis present and compartment soft.   LABS  Basename  05/29/13    0434   HGB  11.8  HCT  34.0    Discharge Exam: General appearance: alert, cooperative and no distress Extremities: Homans sign is negative, no sign of DVT, no edema, redness or tenderness in the calves or thighs and no ulcers, gangrene or trophic changes  Disposition:   Home-Health Care Svc with follow up in 2 weeks   Follow-up Information   Follow up with Shelda Pal, MD. Schedule an appointment as soon as possible for a visit in 2 weeks.   Contact information:   [address] [place] [address] [phone]       Discharge Orders   Future Orders Complete By Expires     Call MD / Call 911  As directed     Comments:      If you experience chest pain or shortness of breath, CALL 911 and be transported to the hospital emergency room.  If you develope a fever above 101 F, pus (white drainage) or increased drainage or redness at the wound, or calf pain, call your surgeon's  office.    Change dressing  As directed     Comments:      Maintain surgical dressing for 10-14 days, then replace with 4x4 guaze and tape. Keep the area dry and clean.    Constipation Prevention  As directed     Comments:      Drink plenty of fluids.  Prune juice may be helpful.  You may use a stool softener, such as Colace (over the counter) 100 mg twice a day.  Use MiraLax (over the counter) for constipation as needed.    Diet - low sodium heart healthy  As directed     Discharge  instructions  As directed     Comments:      Maintain surgical dressing for 10-14 days, then replace with gauze and tape. Keep the area dry and clean until follow up. Follow up in 2 weeks at [facility]. Call with any questions or concerns.    Increase activity slowly as tolerated  As directed     TED hose  As directed     Comments:      Use stockings (TED hose) for 2 weeks on both leg(s).  You may remove them at night for sleeping.    Weight bearing as tolerated  As directed          Medication List    STOP taking these medications       acetaminophen 500 MG tablet  Commonly known as:  TYLENOL     meloxicam 15 MG tablet  Commonly known as:  MOBIC     traMADol 50 MG tablet  Commonly known as:  ULTRAM      TAKE these medications       aspirin 325 MG EC tablet  Take 1 tablet (325 mg total) by mouth 2 (two) times daily.     DSS 100 MG Caps  Take 100 mg by mouth 2 (two) times daily.     ferrous sulfate 325 (65 FE) MG tablet  Take 1 tablet (325 mg total) by mouth 3 (three) times daily after meals.     HYDROcodone-acetaminophen 7.5-325 MG per tablet  Commonly known as:  NORCO  Take 1-2 tablets by mouth every 4 (four) hours.     methocarbamol 500 MG tablet  Commonly known as:  ROBAXIN  Take 1 tablet (500 mg total) by mouth every 6 (six) hours as needed (muscle spasms).     polyethylene glycol packet  Commonly known as:  MIRALAX / GLYCOLAX  Take 17 g by mouth 2 (two) times daily.         Signed: Anastasio Auerbach. Daris Harkins   PAC  06/01/2013, 9:24 AM

## 2013-11-03 DIAGNOSIS — H4051X3 Glaucoma secondary to other eye disorders, right eye, severe stage: Secondary | ICD-10-CM | POA: Insufficient documentation

## 2013-11-03 DIAGNOSIS — Z8669 Personal history of other diseases of the nervous system and sense organs: Secondary | ICD-10-CM | POA: Insufficient documentation

## 2014-11-08 ENCOUNTER — Other Ambulatory Visit: Payer: Self-pay | Admitting: Family Medicine

## 2014-11-08 DIAGNOSIS — R1032 Left lower quadrant pain: Secondary | ICD-10-CM

## 2014-11-14 ENCOUNTER — Ambulatory Visit
Admission: RE | Admit: 2014-11-14 | Discharge: 2014-11-14 | Disposition: A | Discharge status: Home / Self Care | Payer: BC Managed Care – PPO | Source: Ambulatory Visit | Attending: Family Medicine | Admitting: Family Medicine

## 2014-11-14 DIAGNOSIS — R1032 Left lower quadrant pain: Secondary | ICD-10-CM

## 2014-11-14 MED ORDER — IOHEXOL 300 MG/ML  SOLN
100.0000 mL | Freq: Once | INTRAMUSCULAR | Status: AC | PRN
  Administered 2014-11-14: 100 mL via INTRAVENOUS

## 2016-03-20 IMAGING — CT CT ABD-PELV W/ CM
3 of 5 series · 12 of 36 positions shown, 18 images · IV contrast (READICAT/WATER & [ID] OMNI 300)
Comparison: None.

CLINICAL DATA: Left lower quadrant pain, fullness and bloating for
2 months

EXAM:
CT ABDOMEN AND PELVIS WITH CONTRAST
TECHNIQUE: Multidetector CT imaging of the abdomen and pelvis was performed
using the standard protocol following bolus administration of
intravenous contrast.
CONTRAST:  100mL OMNIPAQUE IOHEXOL 300 MG/ML  SOLN

[Series 3: abd/pelvis with · axial · 0.76mm/px · z∈[-434,-84]mm · 8 of 92 slices shown, 13 images]
[im 11/92  soft-tissue]
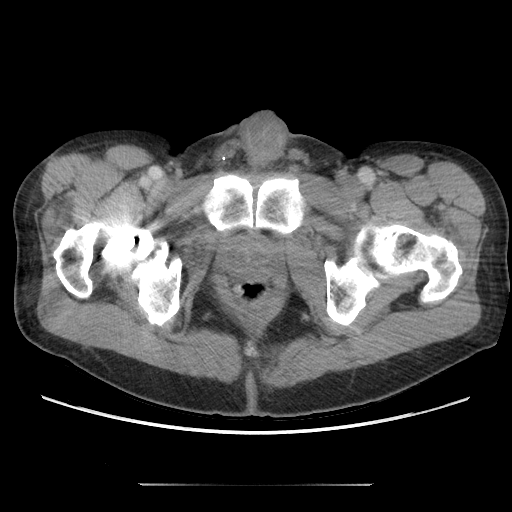
[im 11/92  bone]
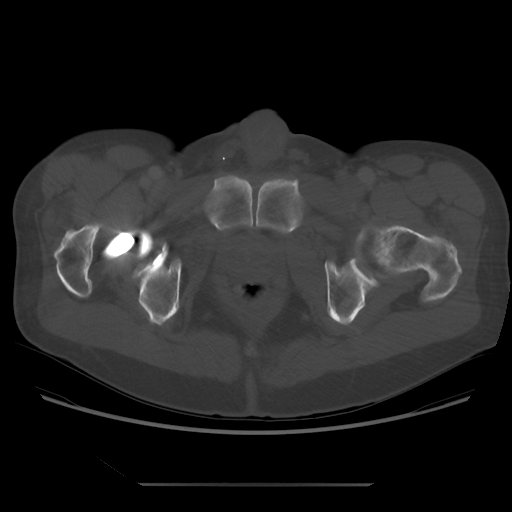
[im 21/92  soft-tissue]
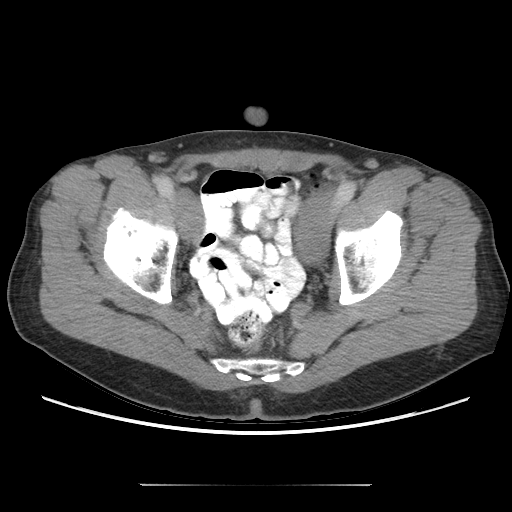
[im 31/92  soft-tissue]
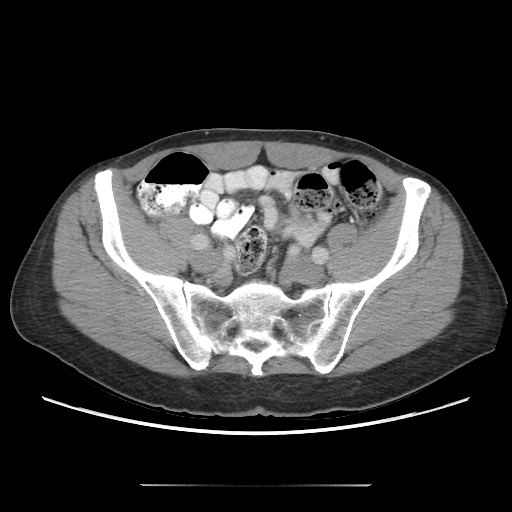
[im 41/92  soft-tissue]
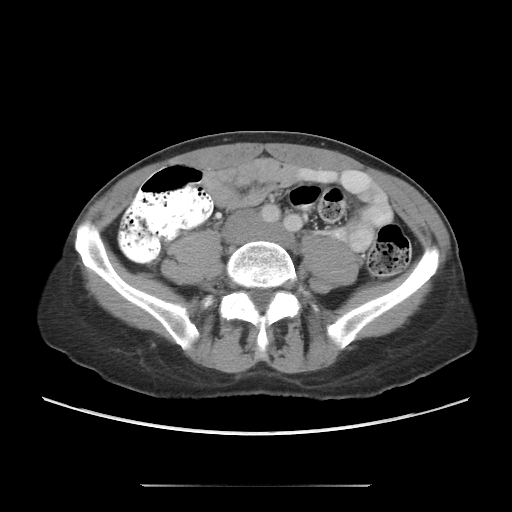
[im 51/92  soft-tissue]
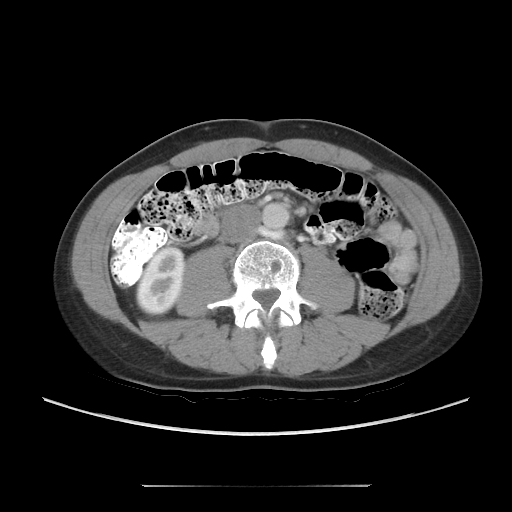
[im 51/92  lung]
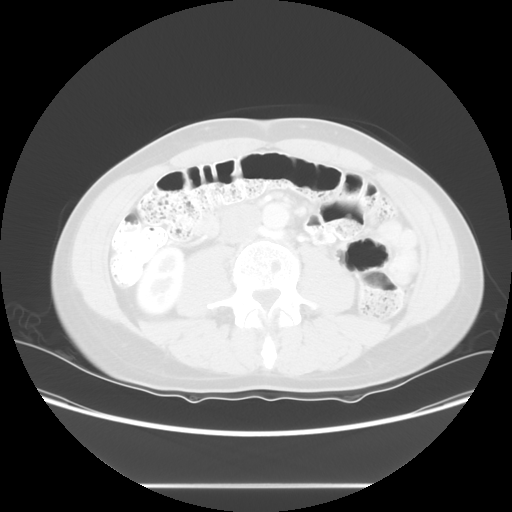
[im 61/92  soft-tissue]
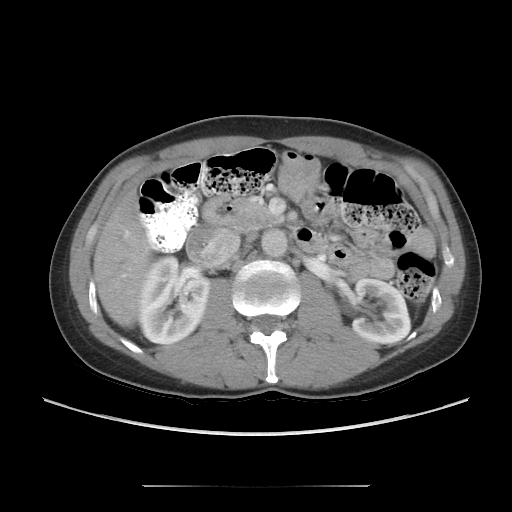
[im 61/92  lung]
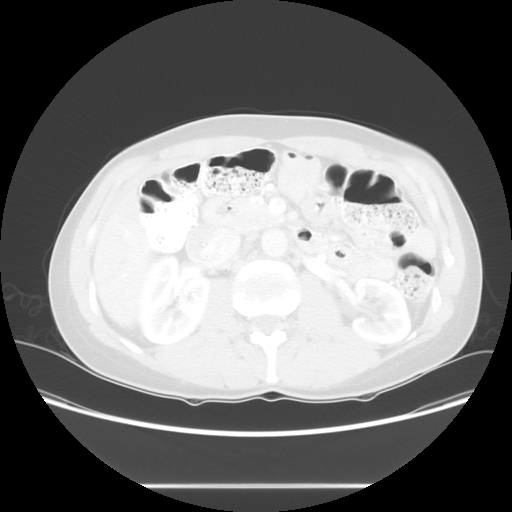
[im 71/92  soft-tissue]
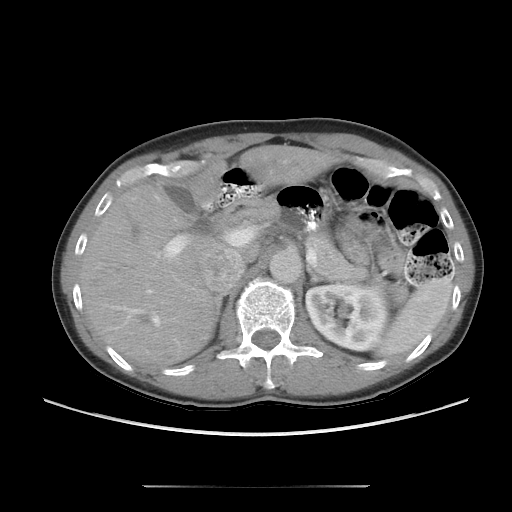
[im 71/92  lung]
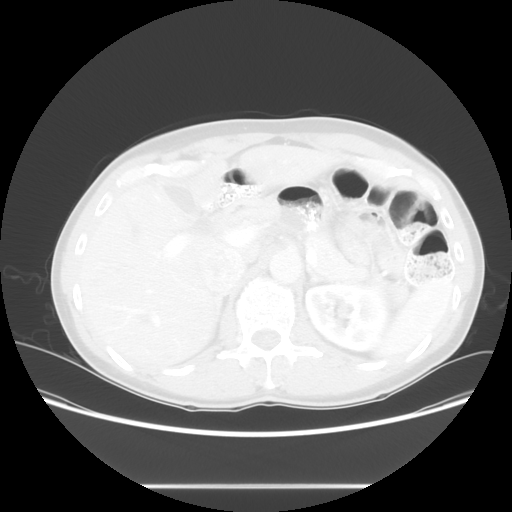
[im 81/92  soft-tissue]
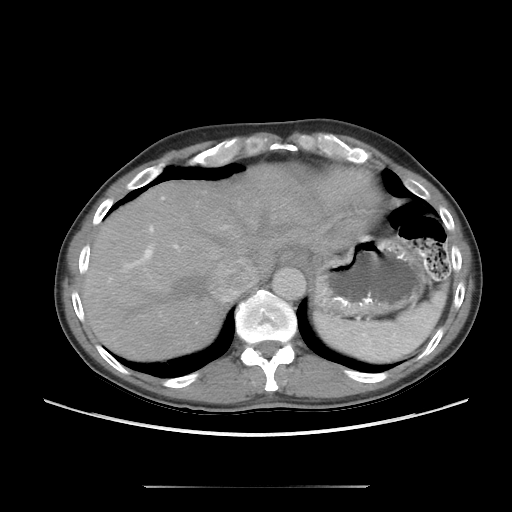
[im 81/92  lung]
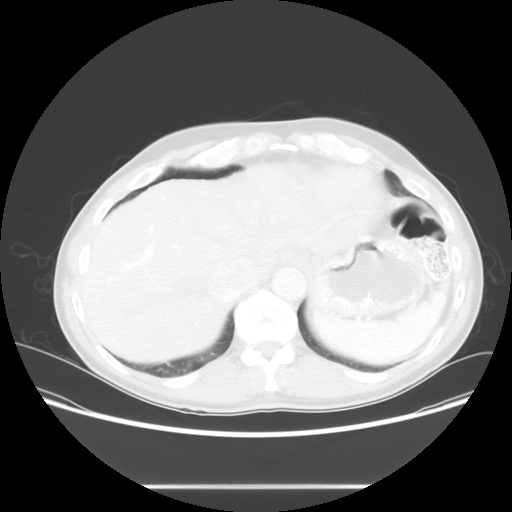

[Series 601: coronal body · coronal · 0.93mm/px · 1 of 104 slices shown, 2 images]
[im 35/104  soft-tissue]
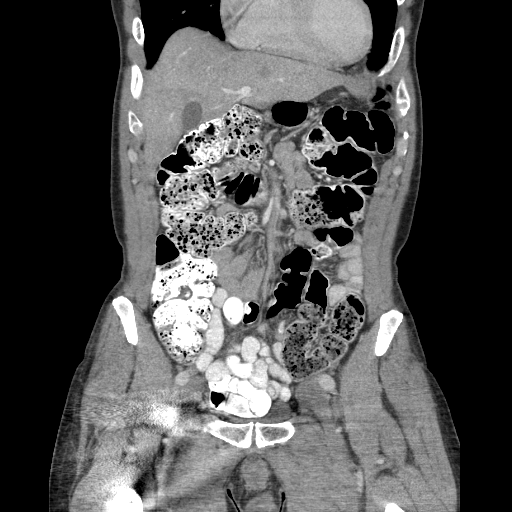
[im 35/104  bone]
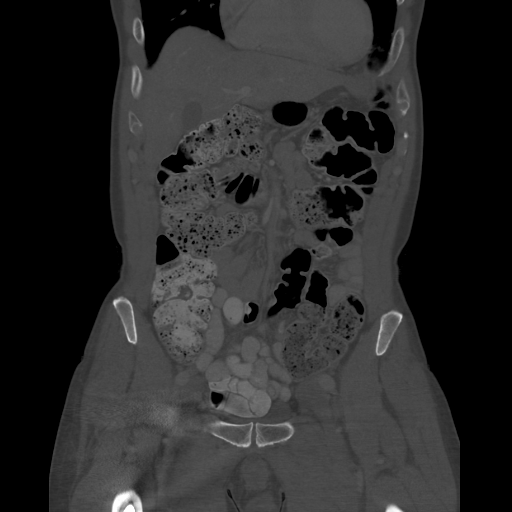

[Series 602: sagittal body · sagittal · 0.93mm/px · 3 of 156 slices shown]
[im 11/156  soft-tissue]
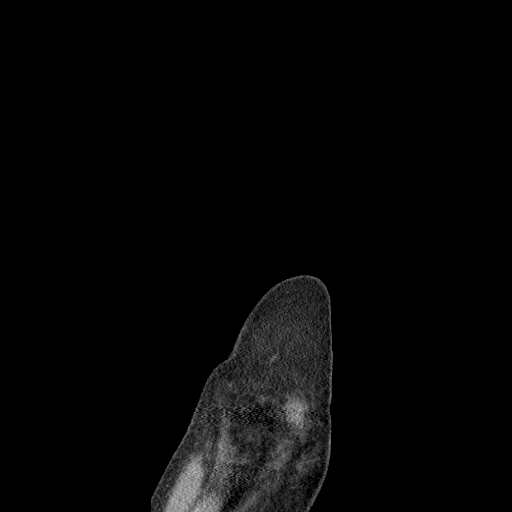
[im 32/156  soft-tissue]
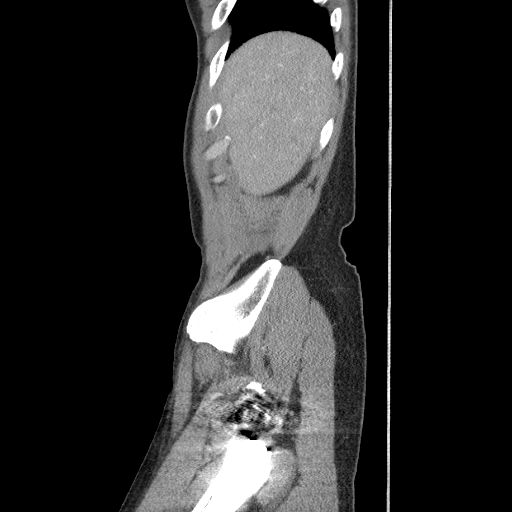
[im 52/156  soft-tissue]
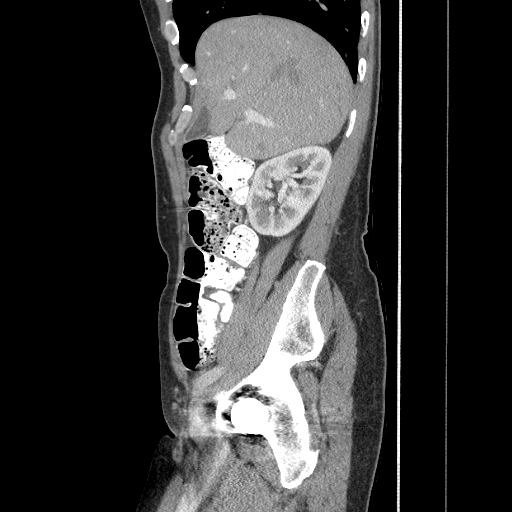

[12 of 36 positions shown; findings below may reference images not displayed]

FINDINGS: The lung bases are clear. The heart is mildly prominent. The liver
enhances with no focal abnormality and no ductal dilatation is seen.
No calcified gallstones are noted. The pancreas is normal in size in
the pancreatic duct is not dilated. The common bile duct at the
region of the ampulla measures 7.6 mm which is minimally prominent.
Correlation with liver function tests is recommended. The adrenal
glands and spleen are unremarkable. The stomach is not well
distended. The kidneys enhance with no calculus or mass and on
delayed images, the pelvocaliceal systems are unremarkable. The
proximal ureters are normal caliber. The abdominal aorta is normal
in caliber. No adenopathy is seen.

The urinary bladder is decompressed and cannot be evaluated. The
prostate is normal in size. Multiple calcified phleboliths are
present in the pelvis. There are prominent iliac veins bilaterally
of questionable significance. No definite pelvic wall adenopathy is
seen. There is no evidence of diverticulitis. No diverticular
formation is evident. The terminal ileum is unremarkable. The
appendix is faintly visualized on the coronal images coursing
retrocecal Niyemedin and it is unremarkable. Prior right total hip
replacement is noted with some degenerative change in the left hip.
The lumbar vertebrae are in normal alignment with relatively normal
disc spaces.
IMPRESSION: 1. No explanation for the patient's left lower quadrant pain is
seen.
2. Slight prominence of the common bile duct near the ampulla.
Correlate with liver function tests.
3. Prominent iliac veins bilaterally of questionable significance.
No adenopathy is seen.
4. No diverticula are noted.
5. The appendix and terminal ileum are unremarkable.

## 2016-10-25 ENCOUNTER — Encounter: Payer: Self-pay | Admitting: *Deleted

## 2016-10-26 ENCOUNTER — Encounter: Payer: Self-pay | Admitting: Student

## 2016-10-26 ENCOUNTER — Ambulatory Visit (INDEPENDENT_AMBULATORY_CARE_PROVIDER_SITE_OTHER): Payer: BLUE CROSS/BLUE SHIELD | Admitting: Student

## 2016-10-26 VITALS — BP 120/80 | HR 69 | Ht 72.0 in | Wt 176.0 lb

## 2016-10-26 DIAGNOSIS — Z01818 Encounter for other preprocedural examination: Secondary | ICD-10-CM | POA: Diagnosis not present

## 2016-10-26 DIAGNOSIS — R9431 Abnormal electrocardiogram [ECG] [EKG]: Secondary | ICD-10-CM

## 2016-10-26 NOTE — Progress Notes (Signed)
Cardiology Office Note    Date:  10/26/2016   ID:  Roanna BanningJames L Tall Sr., DOB [birth year], MRN [id number]  PCP:  Sissy HoffSWAYNE,DAVID W, MD  Cardiologist:  New - Dr. Elberta Fortisamnitz (DOD)  Chief Complaint  Patient presents with  . Pre-op Exam    History of Present Illness:    Verne SpurrJames L Manganello Sr. is a 55 y.o. male with past medical history of arthritis (s/p right total hip arthroplasty in 05/2013), retinal detachment, and glaucoma who presents to the office today as a new patient for preoperative cardiac clearance.  He is scheduled to undergo left hip replacement by Dr. Charlann Boxerlin next week. Right hip replacement was performed in 05/2013 and the patient denies any pre or post-operative complications at that time.  Was seen by his PCP last week for medical clearance in regards to his upcoming surgery. EKG is reported as showing sinus bradycardia, HR 51, with TWI in the precordial leads. This is located in the office note and the actual tracing is not available for review.   Patient reports he is very active at baseline, having run multiple half-marathons and running approximately 30 miles per week 6 months ago, prior to his hip pain limiting activity. He still walks 4-5 miles per day at this time. He denies any exertional chest discomfort or dyspnea with exertion. No orthopnea, PND, lower extremity edema, or palpitations. Is able to climb a flight of stairs multiple times per day without any exertional symptoms.   He denies any prior cardiac history including no prior MI's or cardiac arrhythmias. No known HTN, HLD, or Type 2 DM. Did experience episodes of hypotension around the time of his retinal detachment 5+ years ago. Father passed away from CHF in his 7260's. No tobacco use. Does consume 2-3 alcoholic beverages per night. No recreational drug use.   Past Medical History:  Diagnosis Date  . Arthritis    in hips  . Complication of anesthesia    hard to wake up from 2nd retinal surgery (6weeks after first)     Past Surgical History:  Procedure Laterality Date  . EYE SURGERY Bilateral    retinal detachment, cataracts, right eye glaucoma  . ROTATOR CUFF REPAIR Left 5 years ago  . TONSILLECTOMY    . TOTAL HIP ARTHROPLASTY Right 05/28/2013   Procedure: RIGHT TOTAL HIP ARTHROPLASTY ANTERIOR APPROACH;  Surgeon: Shelda PalMatthew D Olin, MD;  Location: [facility];  Service: Orthopedics;  Laterality: Right;    Current Medications: Outpatient Medications Prior to Visit  Medication Sig Dispense Refill  . aspirin EC 325 MG EC tablet Take 1 tablet (325 mg total) by mouth 2 (two) times daily. 60 tablet 0  . docusate sodium 100 MG CAPS Take 100 mg by mouth 2 (two) times daily. 10 capsule 0  . ferrous sulfate 325 (65 FE) MG tablet Take 1 tablet (325 mg total) by mouth 3 (three) times daily after meals.  3  . HYDROcodone-acetaminophen (NORCO) 7.5-325 MG per tablet Take 1-2 tablets by mouth every 4 (four) hours. 120 tablet 0  . methocarbamol (ROBAXIN) 500 MG tablet Take 1 tablet (500 mg total) by mouth every 6 (six) hours as needed (muscle spasms).    . polyethylene glycol (MIRALAX / GLYCOLAX) packet Take 17 g by mouth 2 (two) times daily. 14 each 0   No facility-administered medications prior to visit.      Allergies:   Penicillins   Social History   Social History  . Marital status: Married    Spouse  name: N/A  . Number of children: N/A  . Years of education: N/A   Social History Main Topics  . Smoking status: Never Smoker  . Smokeless tobacco: Never Used  . Alcohol use Yes     Comment: about 1 a day  . Drug use: No  . Sexual activity: Not Asked   Other Topics Concern  . None   Social History Narrative  . None     Family History:  The patient's family history includes Brain cancer in his mother; Colon cancer in his maternal grandmother.   Review of Systems:   Please see the history of present illness.     General:  No chills, fever, night sweats or weight changes.  Cardiovascular:  No chest  pain, dyspnea on exertion, edema, orthopnea, palpitations, paroxysmal nocturnal dyspnea. Dermatological: No rash, lesions/masses Respiratory: No cough, dyspnea Urologic: No hematuria, dysuria Abdominal:   No nausea, vomiting, diarrhea, bright red blood per rectum, melena, or hematemesis Neurologic:  No visual changes, wkns, changes in mental status. MSK: Positive for left hip pain.  All other systems reviewed and are otherwise negative except as noted above.   Physical Exam:    VS:  BP 120/80   Pulse 69   Ht 6' (1.829 m)   Wt 176 lb (79.8 kg)   BMI 23.87 kg/m    General: Well developed, well nourished,male appearing in no acute distress. Head: Normocephalic, atraumatic, sclera non-icteric, no xanthomas, nares are without discharge.  Neck: No carotid bruits. JVD not elevated.  Lungs: Respirations regular and unlabored, without wheezes or rales.  Heart: Regular rate and rhythm. No S3 or S4.  No murmur, no rubs, or gallops appreciated. Abdomen: Soft, non-tender, non-distended with normoactive bowel sounds. No hepatomegaly. No rebound/guarding. No obvious abdominal masses. Msk:  Strength and tone appear normal for age. No joint deformities or effusions. Extremities: No clubbing or cyanosis. No edema.  Distal pedal pulses are 2+ bilaterally. Neuro: Alert and oriented X 3. Moves all extremities spontaneously. No focal deficits noted. Psych:  Responds to questions appropriately with a normal affect. Skin: No rashes or lesions noted  Wt Readings from Last 3 Encounters:  10/26/16 176 lb (79.8 kg)  05/29/13 176 lb (79.8 kg)  05/17/13 176 lb (79.8 kg)     Studies/Labs Reviewed:   EKG:  EKG is ordered today. The EKG ordered today demonstrates NSR, HR 69, with no acute ST or T-wave changes.   Recent Labs: No results found for requested labs within last 8760 hours.   Lipid Panel No results found for: CHOL, TRIG, HDL, CHOLHDL, VLDL, LDLCALC, LDLDIRECT  Additional studies/ records that  were reviewed today include:  None.  Assessment:    1. Pre-operative clearance   2. Abnormal EKG      Plan:   In order of problems listed above:  1. Preoperative Cardiac Clearance for Left Hip Replacement - scheduled to undergo left hip replacement by Dr. Charlann Boxerlin next week. Referred for cardiac clearance in regards to abnormal EKG. His tracing today is without any acute ST or T-wave changes.  - he walks 4-5 miles per day and is able to climb a flight of stairs multiple times per day without any chest discomfort or dyspnea with exertion.  - No history of prior MI's or cardiac arrhythmias. No known HTN, HLD, or Type 2 DM.  - With EKG today being without any acute changes and him being able to climb a flight of stairs and walk 4-5 miles daily without anginal  symptoms, would not pursue further ischemic testing at this time. Discussed the assessment and plan with DOD, Dr. Elberta Fortis, as the patient is new to our practice. He is in agreement with this.  - Overall, the patient is low-risk from a cardiac perspective for his upcoming surgery.  2. Abnormal EKG - prior EKG reported as showing sinus bradycardia, HR 51, with TWI in the precordial leads. Repeat EKG today is without TWI (expect in lead AVR which is still defined as a normal tracing). Possible abnormal lead placement on initial tracing.    Medication Adjustments/Labs and Tests Ordered: Current medicines are reviewed at length with the patient today.  Concerns regarding medicines are outlined above.  Medication changes, Labs and Tests ordered today are listed in the Patient Instructions below. Patient Instructions  Medication Instructions:   Your physician recommends that you continue on your current medications as directed. Please refer to the Current Medication list given to you today.  If you need a refill on your cardiac medications before your next appointment, please call your pharmacy.  Labwork: NONE ORDERED   TODAY  Testing/Procedures: NONE ORDERED  TODAY  Follow-Up: CONTACT [facility] [phone] AS NEEDED FOR  ANY CARDIAC RELATED SYMPTOMS  Any Other Special Instructions Will Be Listed Below (If Applicable).Leonides Schanz Ellsworth Lennox, PA  10/26/2016 4:00 PM    [facility] [address] [place], [address] [place], [place]  [address] Phone: [phone]; Fax: [phone]  [address] [place], [place] [address] Phone: [phone]

## 2016-10-26 NOTE — Patient Instructions (Addendum)

## 2017-03-01 ENCOUNTER — Ambulatory Visit
Admission: RE | Admit: 2017-03-01 | Discharge: 2017-03-01 | Disposition: A | Discharge status: Home / Self Care | Payer: BLUE CROSS/BLUE SHIELD | Source: Ambulatory Visit | Attending: Family Medicine | Admitting: Family Medicine

## 2017-03-01 ENCOUNTER — Other Ambulatory Visit: Payer: Self-pay | Admitting: Family Medicine

## 2017-03-01 DIAGNOSIS — R1032 Left lower quadrant pain: Secondary | ICD-10-CM

## 2017-09-27 DIAGNOSIS — J029 Acute pharyngitis, unspecified: Secondary | ICD-10-CM | POA: Diagnosis not present

## 2017-11-06 ENCOUNTER — Encounter (HOSPITAL_BASED_OUTPATIENT_CLINIC_OR_DEPARTMENT_OTHER): Payer: Self-pay | Admitting: Emergency Medicine

## 2017-11-06 ENCOUNTER — Emergency Department (HOSPITAL_BASED_OUTPATIENT_CLINIC_OR_DEPARTMENT_OTHER)
Admission: EM | Admit: 2017-11-06 | Discharge: 2017-11-06 | Disposition: A | Discharge status: Home / Self Care | Payer: BLUE CROSS/BLUE SHIELD | Attending: Emergency Medicine | Admitting: Emergency Medicine

## 2017-11-06 ENCOUNTER — Other Ambulatory Visit: Payer: Self-pay

## 2017-11-06 DIAGNOSIS — H409 Unspecified glaucoma: Secondary | ICD-10-CM | POA: Insufficient documentation

## 2017-11-06 DIAGNOSIS — H5711 Ocular pain, right eye: Secondary | ICD-10-CM | POA: Insufficient documentation

## 2017-11-06 DIAGNOSIS — H5700 Unspecified anomaly of pupillary function: Secondary | ICD-10-CM | POA: Insufficient documentation

## 2017-11-06 DIAGNOSIS — H538 Other visual disturbances: Secondary | ICD-10-CM | POA: Diagnosis not present

## 2017-11-06 MED ORDER — TETRACAINE HCL 0.5 % OP SOLN
1.0000 [drp] | Freq: Once | OPHTHALMIC | Status: AC
  Administered 2017-11-06: 1 [drp] via OPHTHALMIC
  Filled 2017-11-06: qty 4

## 2017-11-06 MED ORDER — TIMOLOL MALEATE 0.5 % OP SOLN
1.0000 [drp] | Freq: Once | OPHTHALMIC | Status: AC
  Administered 2017-11-06: 1 [drp] via OPHTHALMIC
  Filled 2017-11-06: qty 5

## 2017-11-06 NOTE — ED Notes (Signed)
Pt given d/c instructions as per chart. Verbalizes understanding. No questions. 

## 2017-11-06 NOTE — ED Triage Notes (Addendum)
R eye pain for several days. States he is sure his eye pressure is up with hx of same. Previous eye surgeries. Pt states earlier today he had a white cloud come over the vision of his R eye but that has passed.

## 2017-11-06 NOTE — Discharge Instructions (Addendum)
Use timolol (eye drop) 1 drop into R eye twice a day.

## 2017-11-06 NOTE — ED Provider Notes (Signed)
[facility] EMERGENCY DEPARTMENT Provider Note   CSN: [id number] Arrival date & time: 11/06/17  1521     History   Chief Complaint Chief Complaint  Patient presents with  . Eye Pain    HPI Anthony BanningJames L Hiller Sr. is a 56 y.o. male.  HPI   56 year old male with pain in R eye and blurred vision.  Patient was bending over earlier today and had significant pressure in/around his right eye.  He felt like there is a white cloud hanging in front of it.  This resolved as he straightened back up and feels like visual acuity is back to baseline.  He has had continued pressure though since then.  Has a complicated ophthalmologic history.  He does report a past history of glaucoma.  Not on meds currently. He has not seen his ophthalmologist in approximately 2 years.   Past Medical History:  Diagnosis Date  . Arthritis    in hips  . Complication of anesthesia    hard to wake up from 2nd retinal surgery (6weeks after first)    Patient Active Problem List   Diagnosis Date Noted  . Expected blood loss anemia 05/29/2013  . S/P right THA, AA 05/28/2013    Past Surgical History:  Procedure Laterality Date  . EYE SURGERY Bilateral    retinal detachment, cataracts, right eye glaucoma  . ROTATOR CUFF REPAIR Left 5 years ago  . TONSILLECTOMY         Home Medications    Prior to Admission medications   Not on File    Family History Family History  Problem Relation Age of Onset  . Brain cancer Mother   . Heart failure Father   . Colon cancer Maternal Grandmother     Social History Social History   Tobacco Use  . Smoking status: Never Smoker  . Smokeless tobacco: Never Used  Substance Use Topics  . Alcohol use: Yes    Comment: about 1 a day  . Drug use: No     Allergies   Penicillins   Review of Systems Review of Systems  All systems reviewed and negative, other than as noted in HPI.   Physical Exam Updated Vital Signs BP 129/64 (BP Location: Left Arm)    Pulse (!) 56   Temp 99.4 F (37.4 C) (Oral)   Resp 20   Ht 6' (1.829 m)   Wt 79.8 kg (176 lb)   SpO2 100%   BMI 23.87 kg/m   Physical Exam  Constitutional: He appears well-developed and well-nourished. No distress.  HENT:  Head: Normocephalic and atraumatic.  Eyes: Conjunctivae are normal. Right eye exhibits no discharge. Left eye exhibits no discharge.  Lids/lashes/periorbital tissues grossly normal in appearance. R pupil irregular. No conjunctival injection. No tearing/drainage. EOMI. IOP OD 34/36. OS 16/16.   Neck: Neck supple.  Cardiovascular: Normal rate, regular rhythm and normal heart sounds. Exam reveals no gallop and no friction rub.  No murmur heard. Pulmonary/Chest: Effort normal and breath sounds normal. No respiratory distress.  Abdominal: Soft. He exhibits no distension. There is no tenderness.  Musculoskeletal: He exhibits no edema or tenderness.  Neurological: He is alert.  Skin: Skin is warm and dry.  Psychiatric: He has a normal mood and affect. His behavior is normal. Thought content normal.  Nursing note and vitals reviewed.    ED Treatments / Results  Labs (all labs ordered are listed, but only abnormal results are displayed) Labs Reviewed - No data to display  EKG  EKG Interpretation None       Radiology No results found.  Procedures Procedures (including critical care time)  CRITICAL CARE Performed by: Raeford RazorKOHUT, Nishanth Mccaughan Total critical care time: 35 minutes Critical care time was exclusive of separately billable procedures and treating other patients. Critical care was necessary to treat or prevent imminent or life-threatening deterioration. Critical care was time spent personally by me on the following activities: development of treatment plan with patient and/or surrogate as well as nursing, discussions with consultants, evaluation of patient's response to treatment, examination of patient, obtaining history from patient or surrogate,  ordering and performing treatments and interventions, ordering and review of laboratory studies, ordering and review of radiographic studies, pulse oximetry and re-evaluation of patient's condition.   Medications Ordered in ED Medications  timolol (TIMOPTIC) 0.5 % ophthalmic solution 1 drop (not administered)  tetracaine (PONTOCAINE) 0.5 % ophthalmic solution 1 drop (1 drop Both Eyes Given by Other 11/06/17 1546)     Initial Impression / Assessment and Plan / ED Course  I have reviewed the triage vital signs and the nursing notes.  Pertinent labs & imaging results that were available during my care of the patient were reviewed by me and considered in my medical decision making (see chart for details).     56yM with R eye pain and blurred vision.  Complex ophthalmologic history beyond my complete understanding, but does have IOP in the 30s OD currently.  Timolol in ED and given bottle. Advised to take 1 drop BID.  He has been on this previously.  He needs to follow-up with Dr. Lottie DawsonBond this upcoming week.  Final Clinical Impressions(s) / ED Diagnoses   Final diagnoses:  Glaucoma of right eye, unspecified glaucoma type    ED Discharge Orders    None       Raeford RazorKohut, Stavros Cail, MD 11/06/17 [phone]

## 2017-11-10 DIAGNOSIS — Z713 Dietary counseling and surveillance: Secondary | ICD-10-CM | POA: Diagnosis not present

## 2017-11-10 DIAGNOSIS — Z23 Encounter for immunization: Secondary | ICD-10-CM | POA: Diagnosis not present

## 2017-11-10 DIAGNOSIS — Z136 Encounter for screening for cardiovascular disorders: Secondary | ICD-10-CM | POA: Diagnosis not present

## 2017-11-10 DIAGNOSIS — Z131 Encounter for screening for diabetes mellitus: Secondary | ICD-10-CM | POA: Diagnosis not present

## 2017-11-10 DIAGNOSIS — E785 Hyperlipidemia, unspecified: Secondary | ICD-10-CM | POA: Diagnosis not present

## 2017-11-10 DIAGNOSIS — Z13220 Encounter for screening for lipoid disorders: Secondary | ICD-10-CM | POA: Diagnosis not present

## 2017-11-29 DIAGNOSIS — Z961 Presence of intraocular lens: Secondary | ICD-10-CM | POA: Diagnosis not present

## 2017-11-29 DIAGNOSIS — Z8669 Personal history of other diseases of the nervous system and sense organs: Secondary | ICD-10-CM | POA: Diagnosis not present

## 2017-11-29 DIAGNOSIS — H4051X4 Glaucoma secondary to other eye disorders, right eye, indeterminate stage: Secondary | ICD-10-CM | POA: Diagnosis not present

## 2018-01-31 DIAGNOSIS — Z961 Presence of intraocular lens: Secondary | ICD-10-CM | POA: Diagnosis not present

## 2018-01-31 DIAGNOSIS — Z8669 Personal history of other diseases of the nervous system and sense organs: Secondary | ICD-10-CM | POA: Diagnosis not present

## 2018-01-31 DIAGNOSIS — H4051X4 Glaucoma secondary to other eye disorders, right eye, indeterminate stage: Secondary | ICD-10-CM | POA: Diagnosis not present

## 2018-06-26 DIAGNOSIS — M545 Low back pain: Secondary | ICD-10-CM | POA: Diagnosis not present

## 2018-06-26 DIAGNOSIS — M2560 Stiffness of unspecified joint, not elsewhere classified: Secondary | ICD-10-CM | POA: Diagnosis not present

## 2018-06-26 DIAGNOSIS — R293 Abnormal posture: Secondary | ICD-10-CM | POA: Diagnosis not present

## 2018-06-26 DIAGNOSIS — M9903 Segmental and somatic dysfunction of lumbar region: Secondary | ICD-10-CM | POA: Diagnosis not present

## 2018-06-28 DIAGNOSIS — M545 Low back pain: Secondary | ICD-10-CM | POA: Diagnosis not present

## 2018-06-28 DIAGNOSIS — R293 Abnormal posture: Secondary | ICD-10-CM | POA: Diagnosis not present

## 2018-06-28 DIAGNOSIS — M9903 Segmental and somatic dysfunction of lumbar region: Secondary | ICD-10-CM | POA: Diagnosis not present

## 2018-06-28 DIAGNOSIS — M2560 Stiffness of unspecified joint, not elsewhere classified: Secondary | ICD-10-CM | POA: Diagnosis not present

## 2018-07-06 IMAGING — US US PELVIS LIMITED
1 series · 6 of 6 positions shown · non-contrast
Comparison: 11/14/2014 CT of the abdomen and pelvis.

CLINICAL DATA: 55 y/o M; left lower quadrant abdominal pain and
question of hernia.

EXAM:
US PELVIS LIMITED
TECHNIQUE: Grayscale Doppler evaluation of the left lower quadrant.

[Series 1: us pelvis limited · 0.19mm/px · 6 of 6 slices shown]
[im 1/6]
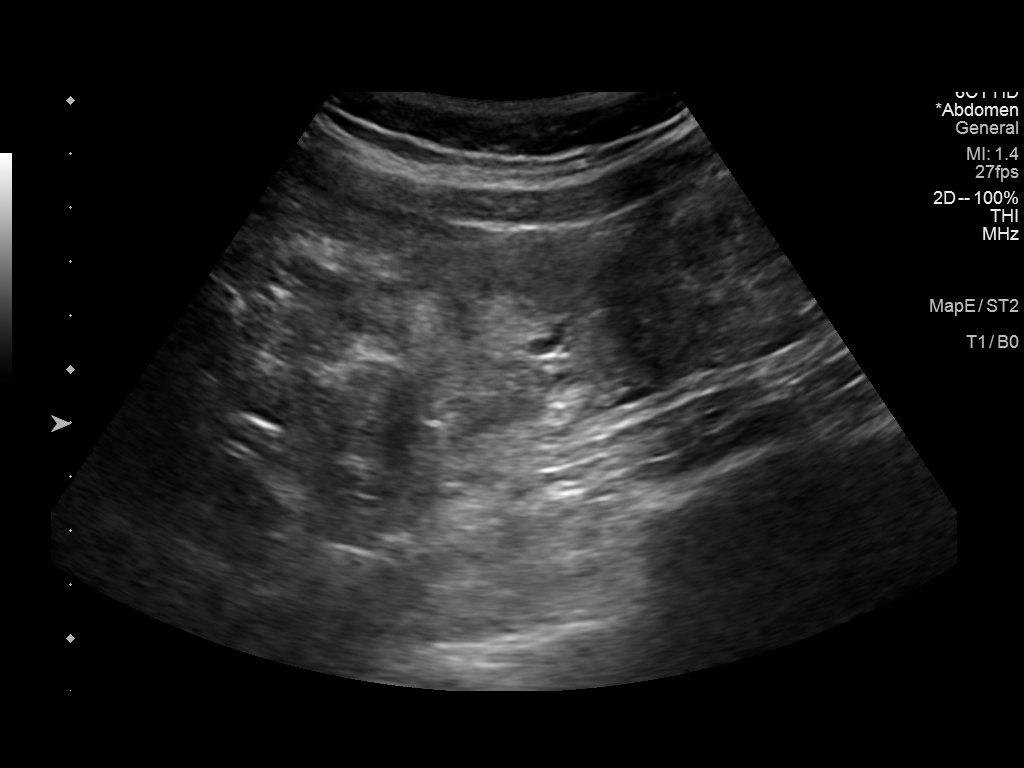
[im 2/6]
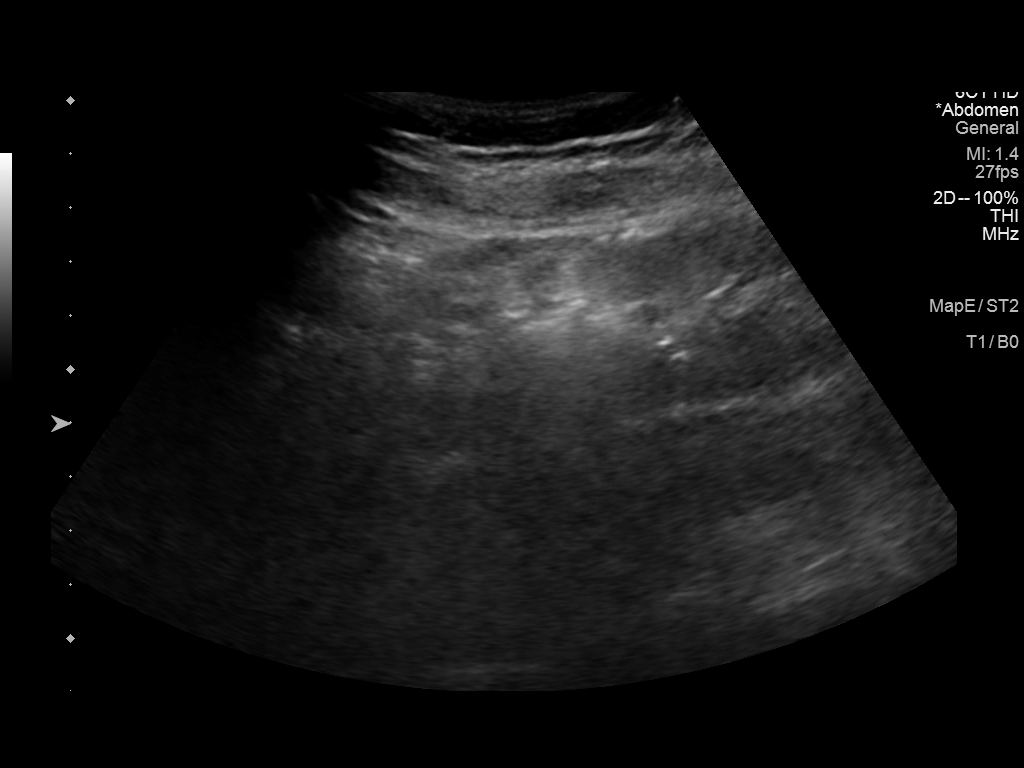
[im 3/6]
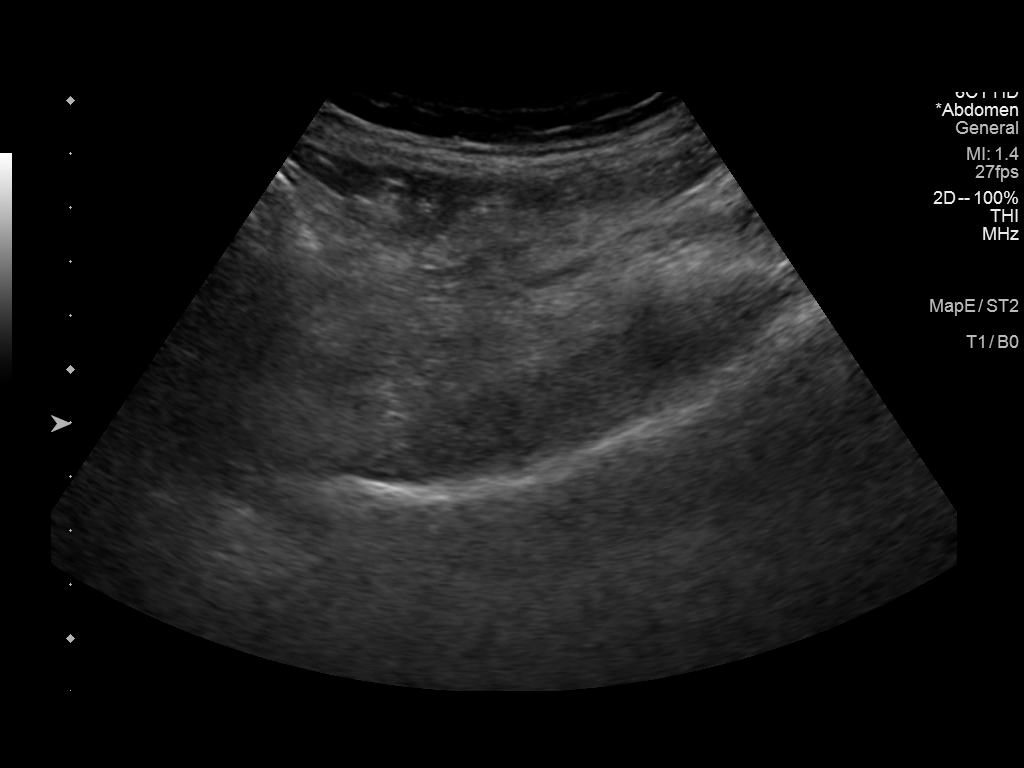
[im 4/6]
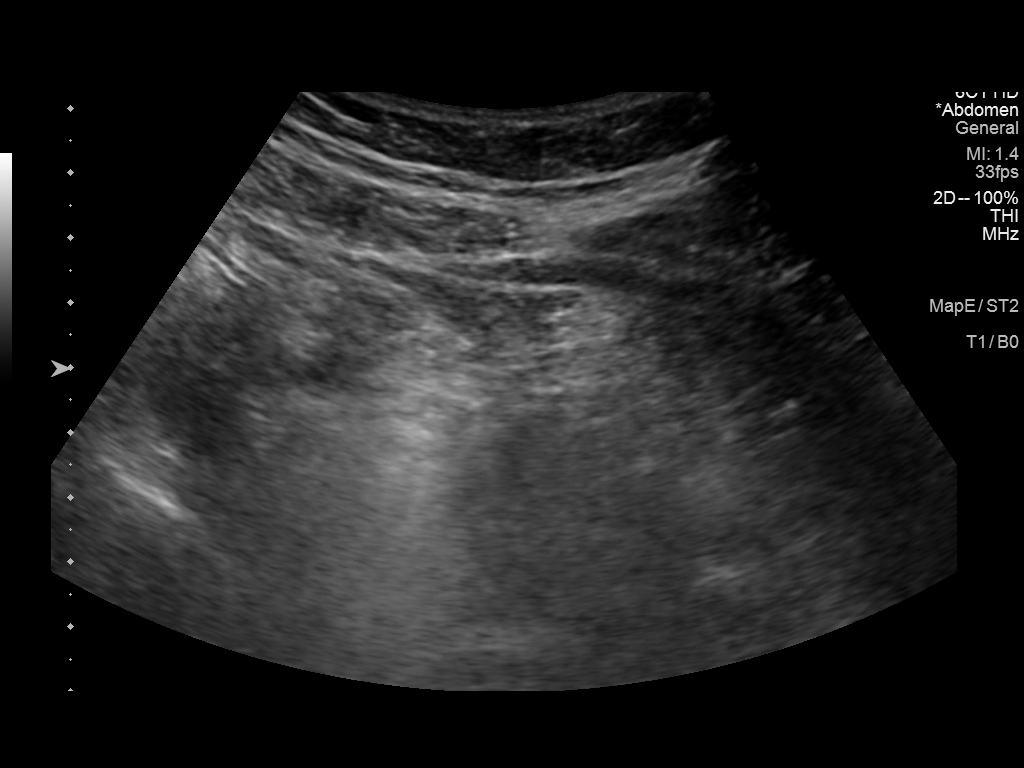
[im 5/6]
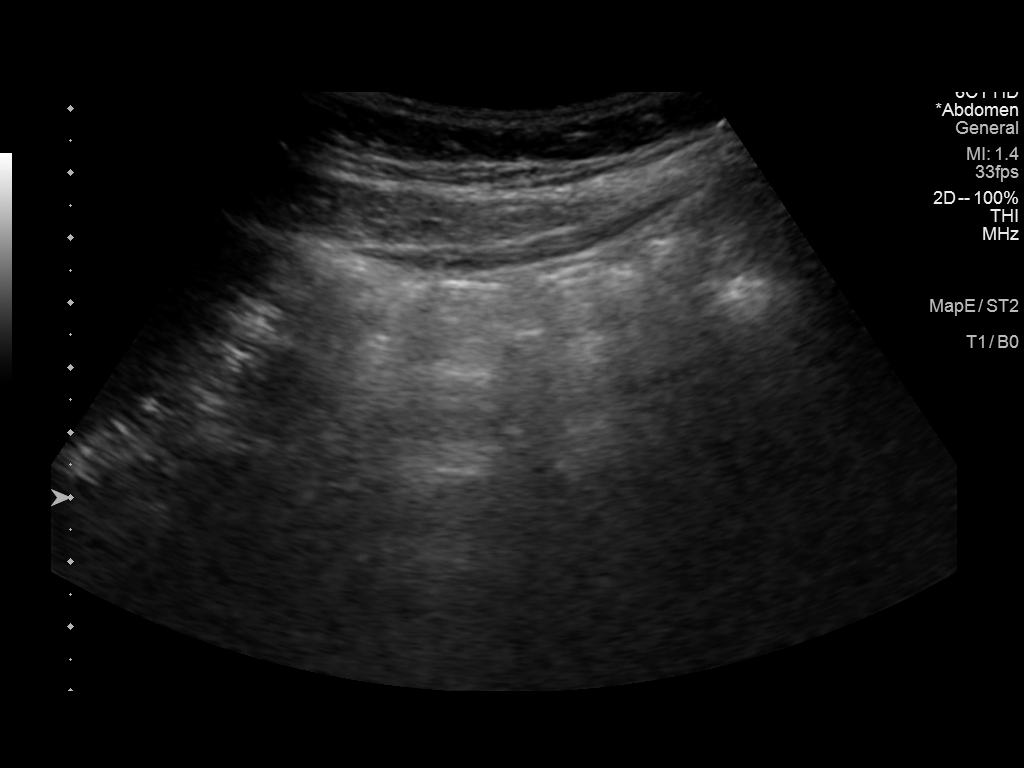
[im 6/6]
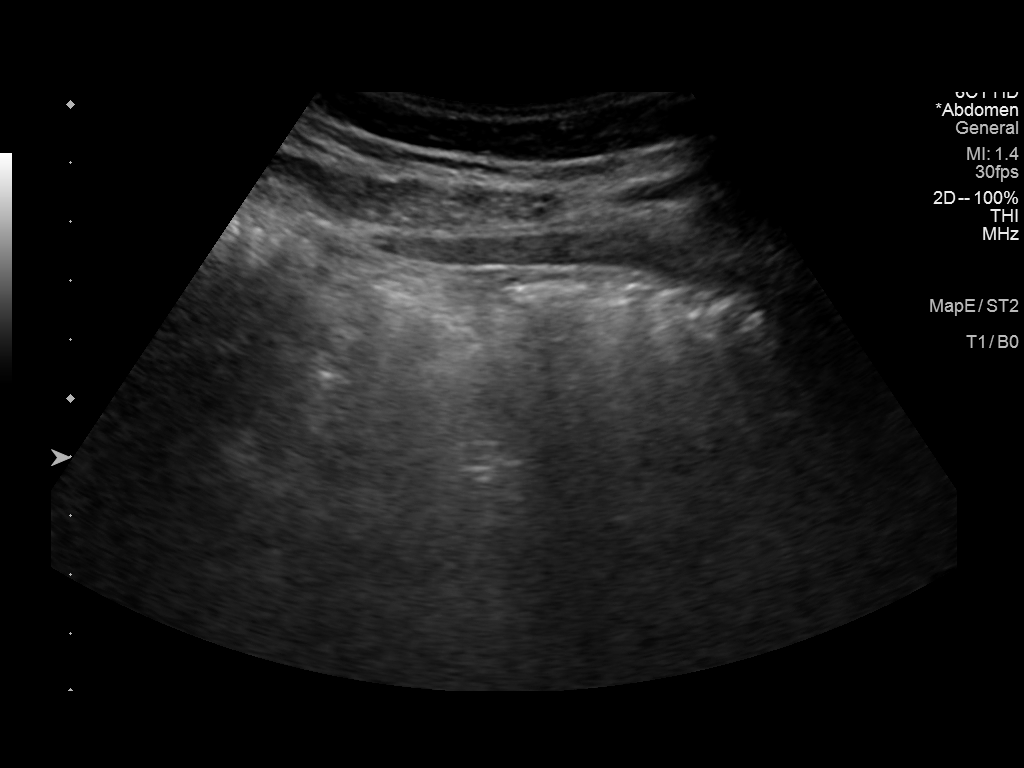

[6 of 6 positions shown; findings below may reference images not displayed]

FINDINGS: No mass, hernia, or collection is identified within the left lower
quadrant in the area of pain. Unremarkable left lower quadrant soft
tissue ultrasound.
IMPRESSION: No mass, hernia, or collection is identified within the left lower
quadrant in the area of pain.

By: Safarikova Eb M.D.

## 2018-08-01 DIAGNOSIS — H4051X4 Glaucoma secondary to other eye disorders, right eye, indeterminate stage: Secondary | ICD-10-CM | POA: Diagnosis not present

## 2018-08-01 DIAGNOSIS — Z9889 Other specified postprocedural states: Secondary | ICD-10-CM | POA: Diagnosis not present

## 2018-08-01 DIAGNOSIS — Z8669 Personal history of other diseases of the nervous system and sense organs: Secondary | ICD-10-CM | POA: Diagnosis not present

## 2018-08-01 DIAGNOSIS — Z961 Presence of intraocular lens: Secondary | ICD-10-CM | POA: Diagnosis not present

## 2018-09-08 DIAGNOSIS — R1032 Left lower quadrant pain: Secondary | ICD-10-CM | POA: Diagnosis not present

## 2018-09-08 DIAGNOSIS — Z0000 Encounter for general adult medical examination without abnormal findings: Secondary | ICD-10-CM | POA: Diagnosis not present

## 2018-09-08 DIAGNOSIS — Z23 Encounter for immunization: Secondary | ICD-10-CM | POA: Diagnosis not present

## 2019-01-30 DIAGNOSIS — Z961 Presence of intraocular lens: Secondary | ICD-10-CM | POA: Diagnosis not present

## 2019-01-30 DIAGNOSIS — H4051X4 Glaucoma secondary to other eye disorders, right eye, indeterminate stage: Secondary | ICD-10-CM | POA: Diagnosis not present

## 2019-01-30 DIAGNOSIS — Z8669 Personal history of other diseases of the nervous system and sense organs: Secondary | ICD-10-CM | POA: Diagnosis not present

## 2019-08-14 DIAGNOSIS — R1032 Left lower quadrant pain: Secondary | ICD-10-CM | POA: Diagnosis not present

## 2019-08-15 ENCOUNTER — Ambulatory Visit (INDEPENDENT_AMBULATORY_CARE_PROVIDER_SITE_OTHER): Payer: BC Managed Care – PPO

## 2019-08-15 ENCOUNTER — Other Ambulatory Visit: Payer: Self-pay

## 2019-08-15 ENCOUNTER — Other Ambulatory Visit: Payer: Self-pay | Admitting: Podiatry

## 2019-08-15 ENCOUNTER — Ambulatory Visit (INDEPENDENT_AMBULATORY_CARE_PROVIDER_SITE_OTHER): Payer: BC Managed Care – PPO | Admitting: Podiatry

## 2019-08-15 ENCOUNTER — Encounter: Payer: Self-pay | Admitting: Podiatry

## 2019-08-15 VITALS — BP 141/84 | HR 60 | Temp 98.2°F | Resp 16

## 2019-08-15 DIAGNOSIS — M722 Plantar fascial fibromatosis: Secondary | ICD-10-CM | POA: Diagnosis not present

## 2019-08-15 DIAGNOSIS — M79672 Pain in left foot: Secondary | ICD-10-CM

## 2019-08-15 MED ORDER — DICLOFENAC SODIUM 75 MG PO TBEC: 75.0000 mg | DELAYED_RELEASE_TABLET | Freq: Two times a day (BID) | ORAL | 2 refills | Status: AC

## 2019-08-15 NOTE — Progress Notes (Signed)
   Subjective:    Patient ID: Anthony Warren., male    DOB: 07/03/1961, 58 y.o.   MRN: [id number]  HPI    Review of Systems  All other systems reviewed and are negative.      Objective:   Physical Exam        Assessment & Plan:

## 2019-08-15 NOTE — Patient Instructions (Signed)

## 2019-08-15 NOTE — Progress Notes (Signed)
Subjective:   Patient ID: Anthony Warren., male   DOB: 58 y.o.   MRN: [id number]   HPI Patient presents with exquisite discomfort in the plantar aspect of the left heel for about a month.  States is been very bad making it hard for him to be active and he likes to walk and to be active.  Patient does not smoke   Review of Systems  All other systems reviewed and are negative.       Objective:  Physical Exam Vitals signs and nursing note reviewed.  Constitutional:      Appearance: He is well-developed.  Pulmonary:     Effort: Pulmonary effort is normal.  Musculoskeletal: Normal range of motion.  Skin:    General: Skin is warm.  Neurological:     Mental Status: He is alert.     Neurovascular status intact muscle strength found to be adequate range of motion within normal limits with patient found to have exquisite discomfort plantar heel left at the insertional point of the tendon into the calcaneus with inflammation fluid buildup noted     Assessment:  Acute plantar fasciitis left with inflammation and patient was very active and I have encouraged to start hiking     Plan:  H&P x-ray reviewed and today I injected the plantar fascia 3 mg Kenalog 5 mg Xylocaine applied fascial brace gave instructions for physical therapy shoe gear modifications and support and reappoint to recheck  X-ray indicates there is plantar spur formation with no indication to stress fracture arthritis

## 2019-08-28 DIAGNOSIS — H4051X4 Glaucoma secondary to other eye disorders, right eye, indeterminate stage: Secondary | ICD-10-CM | POA: Diagnosis not present

## 2019-08-28 DIAGNOSIS — Z961 Presence of intraocular lens: Secondary | ICD-10-CM | POA: Diagnosis not present

## 2019-08-28 DIAGNOSIS — Z8669 Personal history of other diseases of the nervous system and sense organs: Secondary | ICD-10-CM | POA: Diagnosis not present

## 2019-08-28 DIAGNOSIS — Z9889 Other specified postprocedural states: Secondary | ICD-10-CM | POA: Diagnosis not present

## 2019-08-29 ENCOUNTER — Other Ambulatory Visit: Payer: Self-pay

## 2019-08-29 ENCOUNTER — Ambulatory Visit (INDEPENDENT_AMBULATORY_CARE_PROVIDER_SITE_OTHER): Payer: BC Managed Care – PPO | Admitting: Podiatry

## 2019-08-29 ENCOUNTER — Encounter: Payer: Self-pay | Admitting: Podiatry

## 2019-08-29 DIAGNOSIS — M722 Plantar fascial fibromatosis: Secondary | ICD-10-CM | POA: Diagnosis not present

## 2019-08-29 NOTE — Progress Notes (Signed)
Subjective:   Patient ID: Anthony Warren., male   DOB: 58 y.o.   MRN: [id number]   HPI Patient presents stating he is walking quite a bit better and did walk 5 miles without pain but does have somewhat of a low-grade chronic pain in his heel   ROS      Objective:  Physical Exam  Neurovascular status found to be intact muscle strength adequate with patient having had history of hip replacement and has what appears to be right leg about 1/4 inch shorter than the left leg with concerns about wear on his tennis shoes with mild discomfort still noted heel and swelling     Assessment:  Plantar fascial symptomatology improved present with history of chronic discomfort associated with it with mild leg length discrepancy      Plan:  H&P discussed condition that extended.  And recommended long-term orthotics with slight increase lift on the right versus the left heel even though I think both of them should be lifted slightly due to chronic fascial symptomatology.  Patient will get approval for these and the ped orthotist will work with him on this and I do think an additional 316 6 inch raise on the right would be of benefit to start.  Patient is scheduled for this to be done by ped orthotist will be seen back on an as-needed basis

## 2019-08-31 DIAGNOSIS — Z8669 Personal history of other diseases of the nervous system and sense organs: Secondary | ICD-10-CM | POA: Diagnosis not present

## 2019-08-31 DIAGNOSIS — H4051X4 Glaucoma secondary to other eye disorders, right eye, indeterminate stage: Secondary | ICD-10-CM | POA: Diagnosis not present

## 2019-08-31 DIAGNOSIS — Z961 Presence of intraocular lens: Secondary | ICD-10-CM | POA: Diagnosis not present

## 2019-09-17 ENCOUNTER — Other Ambulatory Visit: Payer: Self-pay

## 2019-09-17 ENCOUNTER — Ambulatory Visit (INDEPENDENT_AMBULATORY_CARE_PROVIDER_SITE_OTHER): Payer: BC Managed Care – PPO | Admitting: Orthotics

## 2019-09-17 DIAGNOSIS — M722 Plantar fascial fibromatosis: Secondary | ICD-10-CM | POA: Diagnosis not present

## 2019-09-17 DIAGNOSIS — M79672 Pain in left foot: Secondary | ICD-10-CM

## 2019-09-17 NOTE — Progress Notes (Signed)
Patient came into today to be cast for [employer]. Upon recommendation of Dr. Paulla Dolly Patient presents with hx of plantar fas, heel pain after walking 5 miles; also he has leg length discrepency 3/16 inch Goals are RF stability, arch support, and deep/wide f/o w/ 3/1inch RIGHT Plan vendor

## 2019-10-09 ENCOUNTER — Other Ambulatory Visit: Payer: BC Managed Care – PPO | Admitting: Orthotics

## 2019-10-11 ENCOUNTER — Other Ambulatory Visit: Payer: BC Managed Care – PPO | Admitting: Orthotics

## 2019-10-22 DIAGNOSIS — Z13220 Encounter for screening for lipoid disorders: Secondary | ICD-10-CM | POA: Diagnosis not present

## 2019-10-22 DIAGNOSIS — Z0000 Encounter for general adult medical examination without abnormal findings: Secondary | ICD-10-CM | POA: Diagnosis not present

## 2019-10-22 DIAGNOSIS — R1032 Left lower quadrant pain: Secondary | ICD-10-CM | POA: Diagnosis not present

## 2019-10-22 DIAGNOSIS — Z131 Encounter for screening for diabetes mellitus: Secondary | ICD-10-CM | POA: Diagnosis not present

## 2019-10-22 DIAGNOSIS — Z23 Encounter for immunization: Secondary | ICD-10-CM | POA: Diagnosis not present

## 2019-10-23 ENCOUNTER — Other Ambulatory Visit: Payer: Self-pay

## 2019-10-23 ENCOUNTER — Ambulatory Visit: Payer: BC Managed Care – PPO | Admitting: Orthotics

## 2019-10-23 DIAGNOSIS — M722 Plantar fascial fibromatosis: Secondary | ICD-10-CM

## 2019-10-23 NOTE — Progress Notes (Signed)

## 2019-11-05 DIAGNOSIS — H4051X4 Glaucoma secondary to other eye disorders, right eye, indeterminate stage: Secondary | ICD-10-CM | POA: Diagnosis not present

## 2020-01-31 ENCOUNTER — Encounter: Payer: Self-pay | Admitting: Internal Medicine

## 2020-02-11 ENCOUNTER — Ambulatory Visit (AMBULATORY_SURGERY_CENTER): Payer: Self-pay

## 2020-02-11 ENCOUNTER — Other Ambulatory Visit: Payer: Self-pay

## 2020-02-11 VITALS — Temp 97.2°F | Ht 72.0 in | Wt 182.6 lb

## 2020-02-11 DIAGNOSIS — Z01818 Encounter for other preprocedural examination: Secondary | ICD-10-CM

## 2020-02-11 DIAGNOSIS — Z1211 Encounter for screening for malignant neoplasm of colon: Secondary | ICD-10-CM

## 2020-02-11 NOTE — Telephone Encounter (Signed)
Error

## 2020-02-11 NOTE — Progress Notes (Signed)

## 2020-02-21 ENCOUNTER — Ambulatory Visit (INDEPENDENT_AMBULATORY_CARE_PROVIDER_SITE_OTHER): Payer: BC Managed Care – PPO

## 2020-02-21 ENCOUNTER — Other Ambulatory Visit: Payer: Self-pay | Admitting: Internal Medicine

## 2020-02-21 DIAGNOSIS — Z1159 Encounter for screening for other viral diseases: Secondary | ICD-10-CM | POA: Diagnosis not present

## 2020-02-22 ENCOUNTER — Encounter: Payer: Self-pay | Admitting: Internal Medicine

## 2020-02-22 LAB — SARS CORONAVIRUS 2 (TAT 6-24 HRS): SARS Coronavirus 2: NEGATIVE

## 2020-02-26 ENCOUNTER — Encounter: Payer: Self-pay | Admitting: Internal Medicine

## 2020-02-26 ENCOUNTER — Ambulatory Visit (AMBULATORY_SURGERY_CENTER): Payer: BC Managed Care – PPO | Admitting: Internal Medicine

## 2020-02-26 ENCOUNTER — Other Ambulatory Visit: Payer: Self-pay

## 2020-02-26 VITALS — BP 119/75 | HR 55 | Temp 96.8°F | Resp 11 | Ht 72.0 in | Wt 182.0 lb

## 2020-02-26 DIAGNOSIS — Z1211 Encounter for screening for malignant neoplasm of colon: Secondary | ICD-10-CM | POA: Diagnosis not present

## 2020-02-26 MED ORDER — SODIUM CHLORIDE 0.9 % IV SOLN: 500.0000 mL | Freq: Once | INTRAVENOUS | Status: DC

## 2020-02-26 NOTE — Patient Instructions (Addendum)
Other than some mildly swollen hemorrhoids - all ok. Hemorrhoids are normal structures.  I cannot say what is causing the left lower abdominal and groin pain.  Follow-up with Dr. Valentina Lucks on that.  One think to keep in mind is a musculoskeletal issue, including "sports hernia".  I appreciate the opportunity to care for you. Iva Boop, MD, [facility]  Please read handouts provided. Continue present medications.    YOU HAD AN ENDOSCOPIC PROCEDURE TODAY AT THE [facility]:   Refer to the procedure report that was given to you for any specific questions about what was found during the examination.  If the procedure report does not answer your questions, please call your gastroenterologist to clarify.  If you requested that your care partner not be given the details of your procedure findings, then the procedure report has been included in a sealed envelope for you to review at your convenience later.  YOU SHOULD EXPECT: Some feelings of bloating in the abdomen. Passage of more gas than usual.  Walking can help get rid of the air that was put into your GI tract during the procedure and reduce the bloating. If you had a lower endoscopy (such as a colonoscopy or flexible sigmoidoscopy) you may notice spotting of blood in your stool or on the toilet paper. If you underwent a bowel prep for your procedure, you may not have a normal bowel movement for a few days.  Please Note:  You might notice some irritation and congestion in your nose or some drainage.  This is from the oxygen used during your procedure.  There is no need for concern and it should clear up in a day or so.  SYMPTOMS TO REPORT IMMEDIATELY:   Following lower endoscopy (colonoscopy or flexible sigmoidoscopy):  Excessive amounts of blood in the stool  Significant tenderness or worsening of abdominal pains  Swelling of the abdomen that is new, acute  Fever of 100F or higher   For urgent or emergent issues, a  gastroenterologist can be reached at any hour by calling [phone].   DIET:  We do recommend a small meal at first, but then you may proceed to your regular diet.  Drink plenty of fluids but you should avoid alcoholic beverages for 24 hours.  ACTIVITY:  You should plan to take it easy for the rest of today and you should NOT DRIVE or use heavy machinery until tomorrow (because of the sedation medicines used during the test).    FOLLOW UP: Our staff will call the number listed on your records 48-72 hours following your procedure to check on you and address any questions or concerns that you may have regarding the information given to you following your procedure. If we do not reach you, we will leave a message.  We will attempt to reach you two times.  During this call, we will ask if you have developed any symptoms of COVID 19. If you develop any symptoms (ie: fever, flu-like symptoms, shortness of breath, cough etc.) before then, please call [phone].  If you test positive for Covid 19 in the 2 weeks post procedure, please call and report this information to [place].    If any biopsies were taken you will be contacted by phone or by letter within the next 1-3 weeks.  Please call us at [phone] if you have not heard about the biopsies in 3 weeks.    SIGNATURES/CONFIDENTIALITY: You and/or your care partner have signed paperwork which will  be entered into your electronic medical record.  These signatures attest to the fact that that the information above on your After Visit Summary has been reviewed and is understood.  Full responsibility of the confidentiality of this discharge information lies with you and/or your care-partner. 

## 2020-02-26 NOTE — Op Note (Signed)
[facility] Patient Name: Auguste Tebbetts Procedure Date: 02/26/2020 11:07 AM MRN: [id number] Endoscopist: Gatha Mayer , MD Age: 59 Referring MD:  Date of Birth: Dec 25, 1961 Gender: Male Account #: [id number] Procedure:                Colonoscopy Indications:              Screening for colorectal malignant neoplasm Medicines:                Propofol per Anesthesia, Monitored Anesthesia Care Procedure:                Pre-Anesthesia Assessment:                           - Prior to the procedure, a History and Physical                            was performed, and patient medications and                            allergies were reviewed. The patient's tolerance of                            previous anesthesia was also reviewed. The risks                            and benefits of the procedure and the sedation                            options and risks were discussed with the patient.                            All questions were answered, and informed consent                            was obtained. Prior Anticoagulants: The patient has                            taken no previous anticoagulant or antiplatelet                            agents. ASA Grade Assessment: II - A patient with                            mild systemic disease. After reviewing the risks                            and benefits, the patient was deemed in                            satisfactory condition to undergo the procedure.                           After obtaining informed consent, the colonoscope  was passed under direct vision. Throughout the                            procedure, the patient's blood pressure, pulse, and                            oxygen saturations were monitored continuously. The                            Colonoscope was introduced through the anus and                            advanced to the the cecum, identified by   appendiceal orifice and ileocecal valve. The                            colonoscopy was somewhat difficult due to                            significant looping. Successful completion of the                            procedure was aided by applying abdominal pressure.                            The bowel preparation used was Miralax via split                            dose instruction. The ileocecal valve, appendiceal                            orifice, and rectum were photographed. The quality                            of the bowel preparation was excellent. The patient                            tolerated the procedure well. Scope In: 11:22:54 AM Scope Out: 11:36:42 AM Scope Withdrawal Time: 0 hours 9 minutes 10 seconds  Total Procedure Duration: 0 hours 13 minutes 48 seconds  Findings:                 The perianal and digital rectal examinations were                            normal. Pertinent negatives include normal prostate                            (size, shape, and consistency).                           Internal hemorrhoids were found. The hemorrhoids                            were small.  The exam was otherwise without abnormality on                            direct and retroflexion views. Complications:            No immediate complications. Estimated Blood Loss:     Estimated blood loss: none. Impression:               - Internal hemorrhoids.                           - The examination was otherwise normal on direct                            and retroflexion views.                           - No specimens collected. Recommendation:           - Patient has a contact number available for                            emergencies. The signs and symptoms of potential                            delayed complications were discussed with the                            patient. Return to normal activities tomorrow.                            Written  discharge instructions were provided to the                            patient.                           - Resume previous diet.                           - Continue present medications.                           - Repeat colonoscopy in 10 years for screening                            purposes. Iva Boop, MD 02/26/2020 11:45:25 AM This report has been signed electronically.

## 2020-02-26 NOTE — Progress Notes (Signed)
A and O x3. Report to RN. Tolerated MAC anesthesia well.

## 2020-02-26 NOTE — Progress Notes (Signed)
Pt's states no medical or surgical changes since previsit or office visit.  LC - temp DT - vitals 

## 2020-02-28 ENCOUNTER — Telehealth: Payer: Self-pay | Admitting: *Deleted

## 2020-02-28 NOTE — Telephone Encounter (Signed)
  Follow up Call-  Call back number 02/26/2020  Post procedure Call Back phone  # [phone]  Permission to leave phone message Yes  Some recent data might be hidden    LMOM to call back with any questions or concerns.  Also, call back if patient has developed fever, respiratory issues or been dx with COVID or had any family members or close contacts diagnosed since her procedure.

## 2020-02-28 NOTE — Telephone Encounter (Signed)
  Follow up Call-  Call back number 02/26/2020  Post procedure Call Back phone  # [phone]  Permission to leave phone message Yes  Some recent data might be hidden     Patient questions:  Message left to call if necessary.

## 2020-03-10 DIAGNOSIS — H4051X4 Glaucoma secondary to other eye disorders, right eye, indeterminate stage: Secondary | ICD-10-CM | POA: Diagnosis not present

## 2020-03-25 DIAGNOSIS — U071 COVID-19: Secondary | ICD-10-CM | POA: Diagnosis not present

## 2020-03-25 DIAGNOSIS — Z20828 Contact with and (suspected) exposure to other viral communicable diseases: Secondary | ICD-10-CM | POA: Diagnosis not present

## 2020-06-27 DIAGNOSIS — H4051X4 Glaucoma secondary to other eye disorders, right eye, indeterminate stage: Secondary | ICD-10-CM | POA: Diagnosis not present

## 2020-07-07 DIAGNOSIS — M5136 Other intervertebral disc degeneration, lumbar region: Secondary | ICD-10-CM | POA: Diagnosis not present

## 2020-07-07 DIAGNOSIS — M25551 Pain in right hip: Secondary | ICD-10-CM | POA: Diagnosis not present

## 2020-07-07 DIAGNOSIS — M25552 Pain in left hip: Secondary | ICD-10-CM | POA: Diagnosis not present

## 2020-07-07 DIAGNOSIS — M4310 Spondylolisthesis, site unspecified: Secondary | ICD-10-CM | POA: Diagnosis not present
